# Patient Record
Sex: Male | Born: 1950 | Marital: Single | Smoking: Former smoker
Health system: Southern US, Community
[De-identification: ages and names within clinical notes are randomized; demographics above are authoritative.]

## PROBLEM LIST (undated history)

## (undated) DIAGNOSIS — E785 Hyperlipidemia, unspecified: Secondary | ICD-10-CM

## (undated) DIAGNOSIS — E039 Hypothyroidism, unspecified: Secondary | ICD-10-CM

## (undated) DIAGNOSIS — G4730 Sleep apnea, unspecified: Secondary | ICD-10-CM

## (undated) DIAGNOSIS — E079 Disorder of thyroid, unspecified: Secondary | ICD-10-CM

## (undated) DIAGNOSIS — I10 Essential (primary) hypertension: Secondary | ICD-10-CM

## (undated) HISTORY — DX: Essential (primary) hypertension: I10

## (undated) HISTORY — DX: Hyperlipidemia, unspecified: E78.5

## (undated) HISTORY — DX: Disorder of thyroid, unspecified: E07.9

## (undated) HISTORY — PX: DRUG INDUCED ENDOSCOPY: SHX6808

## (undated) HISTORY — PX: COLONOSCOPY: SHX174

---

## 2018-11-15 ENCOUNTER — Encounter: Payer: Self-pay | Admitting: Cardiology

## 2018-11-15 ENCOUNTER — Other Ambulatory Visit: Payer: Self-pay

## 2018-11-15 ENCOUNTER — Ambulatory Visit (INDEPENDENT_AMBULATORY_CARE_PROVIDER_SITE_OTHER): Payer: Medicare Other | Admitting: Cardiology

## 2018-11-15 VITALS — BP 132/74 | HR 50 | Temp 97.3°F | Ht 63.0 in | Wt 160.0 lb

## 2018-11-15 DIAGNOSIS — G4733 Obstructive sleep apnea (adult) (pediatric): Secondary | ICD-10-CM

## 2018-11-15 DIAGNOSIS — I10 Essential (primary) hypertension: Secondary | ICD-10-CM | POA: Diagnosis not present

## 2018-11-15 DIAGNOSIS — Z87891 Personal history of nicotine dependence: Secondary | ICD-10-CM | POA: Diagnosis not present

## 2018-11-15 DIAGNOSIS — I209 Angina pectoris, unspecified: Secondary | ICD-10-CM | POA: Diagnosis not present

## 2018-11-15 DIAGNOSIS — E782 Mixed hyperlipidemia: Secondary | ICD-10-CM | POA: Diagnosis not present

## 2018-11-15 MED ORDER — ISOSORBIDE MONONITRATE ER 30 MG PO TB24: 30.0000 mg | ORAL_TABLET | Freq: Every day | ORAL | 1 refills | Status: DC

## 2018-11-15 MED ORDER — NITROGLYCERIN 0.4 MG SL SUBL: 0.4000 mg | SUBLINGUAL_TABLET | SUBLINGUAL | 2 refills | Status: DC | PRN

## 2018-11-15 MED ORDER — AMLODIPINE BESYLATE 5 MG PO TABS: 5.0000 mg | ORAL_TABLET | Freq: Every day | ORAL | 1 refills | Status: DC

## 2018-11-15 NOTE — Progress Notes (Signed)
Primary Physician/Referring:  Thomas Banks, No Pcp Per  Thomas Banks ID: Majour Frei, male    DOB: 27-Feb-1950, 68 y.o.   MRN: [id number]  Chief Complaint  Thomas Banks presents with  . Chest Pain  . New Thomas Banks (Initial Visit)   HPI:    HPI: Maxtyn Nuzum  is a 68 y.o. with hypertension, hyperlipidemia, and hypothyroidism, presents for evaluation of chest pain.   For the last 1 week, he has noticed some chest pain that he originally noted while running. He has since had on and off chest pain that can even happen at rest or with activities. Generally last for a few minutes before resolving on its own. Denies any shortness of breath. He does notice some tingling/numbness sensation in his left arm with running recently. He generally exercises for 40 minutes twice a day with intervals of walking and running for 2-3 minutes at a time. He has not done this for the last few days due to his symptoms.    He has noticed increased sweating at night while sleeping. He has been found to have sleep apnea many years ago, and was told to use a machine, but he did not feel that he needed to. He does state that he wakes up several times throughout the night.    He is a former smoker that quit many years ago. He does drink a few glasses of scotch a couple of nights a week. No family history of heart disease. He has recently retired.   Past Medical History:  Diagnosis Date  . Hyperlipidemia   . Hypertension   . Thyroid disease    History reviewed. No pertinent surgical history. Social History   Socioeconomic History  . Marital status: Single    Spouse name: Not on file  . Number of children: 3  . Years of education: Not on file  . Highest education level: Not on file  Occupational History  . Not on file  Social Needs  . Financial resource strain: Not on file  . Food insecurity    Worry: Not on file    Inability: Not on file  . Transportation needs    Medical: Not on file    Non-medical: Not on file   Tobacco Use  . Smoking status: Former Smoker    Packs/day: 0.25    Years: 25.00    Pack years: 6.25    Types: Cigarettes    Quit date: 2000    Years since quitting: 20.8  . Smokeless tobacco: Never Used  Substance and Sexual Activity  . Alcohol use: Yes    Comment: occ  . Drug use: Never  . Sexual activity: Not on file  Lifestyle  . Physical activity    Days per week: Not on file    Minutes per session: Not on file  . Stress: Not on file  Relationships  . Social Musician on phone: Not on file    Gets together: Not on file    Attends religious service: Not on file    Active member of club or organization: Not on file    Attends meetings of clubs or organizations: Not on file    Relationship status: Not on file  . Intimate partner violence    Fear of current or ex partner: Not on file    Emotionally abused: Not on file    Physically abused: Not on file    Forced sexual activity: Not on file  Other Topics Concern  .  Not on file  Social History Narrative  . Not on file   ROS  Review of Systems  Constitution: Negative for decreased appetite, malaise/fatigue, weight gain and weight loss.  Eyes: Negative for visual disturbance.  Cardiovascular: Positive for chest pain. Negative for claudication, dyspnea on exertion, leg swelling, orthopnea, palpitations and syncope.  Respiratory: Negative for hemoptysis and wheezing.   Endocrine: Negative for cold intolerance and heat intolerance.  Hematologic/Lymphatic: Does not bruise/bleed easily.  Skin: Negative for nail changes.  Musculoskeletal: Negative for muscle weakness and myalgias.  Gastrointestinal: Negative for abdominal pain, change in bowel habit, nausea and vomiting.  Neurological: Negative for difficulty with concentration, dizziness, focal weakness and headaches.  Psychiatric/Behavioral: Negative for altered mental status and suicidal ideas.  All other systems reviewed and are negative.  Objective  Blood  pressure 132/74, pulse (!) 50, temperature (!) 97.3 F (36.3 C), height 5\' 3"  (1.6 m), weight 160 lb (72.6 kg), SpO2 98 %. Body mass index is 28.34 kg/m.   Physical Exam  Constitutional: He is oriented to person, place, and time. Vital signs are normal. He appears well-developed and well-nourished.  HENT:  Head: Normocephalic and atraumatic.  Neck: Normal range of motion.  Cardiovascular: Normal rate, regular rhythm, normal heart sounds and intact distal pulses.  Pulmonary/Chest: Effort normal and breath sounds normal. No accessory muscle usage. No respiratory distress.  Abdominal: Soft. Bowel sounds are normal.  Musculoskeletal: Normal range of motion.  Neurological: He is alert and oriented to person, place, and time.  Skin: Skin is warm and dry.  Vitals reviewed.  Radiology: No results found.  Laboratory examination:   No results for input(s): NA, K, CL, CO2, GLUCOSE, BUN, CREATININE, CALCIUM, GFRNONAA, GFRAA in the last 8760 hours. No flowsheet data found. No flowsheet data found. Lipid Panel  No results found for: CHOL, TRIG, HDL, CHOLHDL, VLDL, LDLCALC, LDLDIRECT HEMOGLOBIN A1C No results found for: HGBA1C, MPG TSH No results for input(s): TSH in the last 8760 hours. Medications   Current Outpatient Medications  Medication Instructions  . amLODipine (NORVASC) 5 mg, Oral, Daily  . aspirin EC 81 mg, Oral, Daily  . Cyanocobalamin (VITAMIN B 12 PO) 1 tablet, Oral, Daily  . isosorbide mononitrate (IMDUR) 30 mg, Oral, Daily  . levothyroxine (SYNTHROID) 125 mcg, Oral, Daily before breakfast  . Multiple Vitamin (MULTIVITAMIN) capsule 1 capsule, Oral, Daily  . nitroGLYCERIN (NITROSTAT) 0.4 mg, Sublingual, Every 5 min PRN  . rosuvastatin (CRESTOR) 20 mg, Oral, Daily    Cardiac Studies:     Assessment     ICD-10-CM   1. Angina pectoris (HCC)  I20.9 EKG 12-Lead    PCV ECHOCARDIOGRAM COMPLETE    PCV MYOCARDIAL PERFUSION WO LEXISCAN  2. Mixed hyperlipidemia  E78.2   3.  Primary hypertension  I10   4. Former smoker  Z87.891   5. Obstructive sleep apnea  G47.33     EKG 11/15/2018: Sinus bradycardia at 49 bpm, normal axis, no evidence of ischemia.  Recommendations:   Thomas Banks presents for evaluation of chest pain that started 1 week ago.  Thomas Banks has several risk factors for CAD.  EKG and physical exam are unremarkable.  His symptoms are concerning for angina, he will need further evaluation.  We will schedule for exercise nuclear stress testing as well as an echocardiogram to exclude any structural abnormalities.  His blood pressure is well controlled with lisinopril, but will discontinue this in view of his symptoms of angina and change him to amlodipine 5 mg daily.  We  will also add isosorbide 30 mg daily.  I have given him a prescription for nitroglycerin and advised how to use.  Encouraged him to continue to take it easy until he can have further work-up.  He is presently on low-dose aspirin.  He reports his lipids are well controlled, will obtain recent labs from PCP office for our records and further risk stratification.  I will plan to see him back after the test for further conditions and reevaluation.   *I have discussed this case with Dr. Einar Gip and he personally examined the Thomas Banks and participated in formulating the plan.*   Miquel Dunn, MSN, APRN, FNP-C [facility]. [place] Office: [phone] Fax: [phone]

## 2018-11-16 ENCOUNTER — Ambulatory Visit: Payer: Medicare Other | Admitting: Cardiology

## 2018-11-16 NOTE — Progress Notes (Signed)
Patient here only to discuss with Dr. Einar Gip.

## 2018-11-18 NOTE — Progress Notes (Signed)
Labs 10/12/2018: CBC normal, serum glucose 76 mg, BUN 11, creatinine 1.01, eGFR 74/89 ML, potassium 5.4, CMP otherwise normal.  PSA normal.  TSH elevated at 11.23.  Hepatitis see antibody negative.  Total cholesterol 145, triglycerides 91, HDL 61, LDL 67.  Non-HDL cholesterol 85.

## 2018-11-19 ENCOUNTER — Ambulatory Visit (INDEPENDENT_AMBULATORY_CARE_PROVIDER_SITE_OTHER): Payer: Medicare Other

## 2018-11-19 DIAGNOSIS — I209 Angina pectoris, unspecified: Secondary | ICD-10-CM

## 2018-11-22 ENCOUNTER — Ambulatory Visit (INDEPENDENT_AMBULATORY_CARE_PROVIDER_SITE_OTHER): Payer: Medicare Other

## 2018-11-22 DIAGNOSIS — I209 Angina pectoris, unspecified: Secondary | ICD-10-CM

## 2018-11-23 NOTE — Progress Notes (Signed)
S/w pt advised him of test results

## 2019-01-10 ENCOUNTER — Other Ambulatory Visit: Payer: Self-pay

## 2019-01-10 ENCOUNTER — Ambulatory Visit (INDEPENDENT_AMBULATORY_CARE_PROVIDER_SITE_OTHER): Payer: Medicare Other | Admitting: Cardiology

## 2019-01-10 ENCOUNTER — Encounter: Payer: Self-pay | Admitting: Cardiology

## 2019-01-10 VITALS — BP 130/76 | HR 61 | Temp 96.7°F | Resp 16 | Ht 63.0 in | Wt 156.9 lb

## 2019-01-10 DIAGNOSIS — I209 Angina pectoris, unspecified: Secondary | ICD-10-CM | POA: Diagnosis not present

## 2019-01-10 DIAGNOSIS — I10 Essential (primary) hypertension: Secondary | ICD-10-CM

## 2019-01-10 DIAGNOSIS — E782 Mixed hyperlipidemia: Secondary | ICD-10-CM

## 2019-01-10 MED ORDER — AMLODIPINE BESYLATE 5 MG PO TABS: 5.0000 mg | ORAL_TABLET | Freq: Every day | ORAL | 2 refills | Status: DC

## 2019-01-10 NOTE — Progress Notes (Signed)
Primary Physician/Referring:  Lujean Amel, MD  Patient ID: Thomas Banks, male    DOB: 1950/07/04, 69 y.o.   MRN: [id number]  No chief complaint on file.  HPI:    HPI: Thomas Banks  is a 69 y.o. with hypertension, hyperlipidemia, and hypothyroidism, presents for f/u of chest pain.  He is a former smoker that quit many years ago. He does drink a few glasses of scotch a couple of nights a week. No family history of heart disease. He has recently retired. He underwent nuclear stress testing and also echocardiogram and presents for follow-up.   On his last office visit I had discontinued lisinopril and switched him to amlodipine 5 mg and also isosorbide mononitrate.  He developed severe headache and also nausea has discontinued both in his back on lisinopril.  He still has similar anginal symptoms if she starts to run but able to walk same distance without anginal symptoms.  Past Medical History:  Diagnosis Date  . Hyperlipidemia   . Hypertension   . Thyroid disease    No past surgical history on file. Social History   Socioeconomic History  . Marital status: Single    Spouse name: Not on file  . Number of children: 3  . Years of education: Not on file  . Highest education level: Not on file  Occupational History  . Not on file  Tobacco Use  . Smoking status: Former Smoker    Packs/day: 0.25    Years: 25.00    Pack years: 6.25    Types: Cigarettes    Quit date: 2000    Years since quitting: 21.0  . Smokeless tobacco: Never Used  Substance and Sexual Activity  . Alcohol use: Yes    Comment: occ  . Drug use: Never  . Sexual activity: Not on file  Other Topics Concern  . Not on file  Social History Narrative  . Not on file   Social Determinants of Health   Financial Resource Strain:   . Difficulty of Paying Living Expenses: Not on file  Food Insecurity:   . Worried About Charity fundraiser in the Last Year: Not on file  . Ran Out of Food in the Last Year: Not on  file  Transportation Needs:   . Lack of Transportation (Medical): Not on file  . Lack of Transportation (Non-Medical): Not on file  Physical Activity:   . Days of Exercise per Week: Not on file  . Minutes of Exercise per Session: Not on file  Stress:   . Feeling of Stress : Not on file  Social Connections:   . Frequency of Communication with Friends and Family: Not on file  . Frequency of Social Gatherings with Friends and Family: Not on file  . Attends Religious Services: Not on file  . Active Member of Clubs or Organizations: Not on file  . Attends Archivist Meetings: Not on file  . Marital Status: Not on file  Intimate Partner Violence:   . Fear of Current or Ex-Partner: Not on file  . Emotionally Abused: Not on file  . Physically Abused: Not on file  . Sexually Abused: Not on file   ROS  Review of Systems  Constitution: Negative for decreased appetite, malaise/fatigue, weight gain and weight loss.  Eyes: Negative for visual disturbance.  Cardiovascular: Positive for chest pain. Negative for claudication, dyspnea on exertion, leg swelling, orthopnea, palpitations and syncope.  Respiratory: Negative for hemoptysis and wheezing.   Endocrine: Negative for  cold intolerance and heat intolerance.  Hematologic/Lymphatic: Does not bruise/bleed easily.  Skin: Negative for nail changes.  Musculoskeletal: Negative for muscle weakness and myalgias.  Gastrointestinal: Negative for abdominal pain, change in bowel habit, nausea and vomiting.  Neurological: Negative for difficulty with concentration, dizziness, focal weakness and headaches.  Psychiatric/Behavioral: Negative for altered mental status and suicidal ideas.  All other systems reviewed and are negative.  Objective  There were no vitals taken for this visit. There is no height or weight on file to calculate BMI.   Vitals with BMI 11/15/2018  Height '5\' 3"'   Weight 160 lbs  BMI 99.83  Systolic 382  Diastolic 74    Pulse 50    Physical Exam  Constitutional: He is oriented to person, place, and time. Vital signs are normal. He appears well-developed and well-nourished.  HENT:  Head: Normocephalic and atraumatic.  Cardiovascular: Normal rate, regular rhythm, normal heart sounds and intact distal pulses.  Pulmonary/Chest: Effort normal and breath sounds normal. No accessory muscle usage. No respiratory distress.  Abdominal: Soft. Bowel sounds are normal.  Musculoskeletal:        General: Normal range of motion.     Cervical back: Normal range of motion.  Neurological: He is alert and oriented to person, place, and time.  Skin: Skin is warm and dry.  Vitals reviewed.  Radiology: No results found.  Laboratory examination:   No results for input(s): NA, K, CL, CO2, GLUCOSE, BUN, CREATININE, CALCIUM, GFRNONAA, GFRAA in the last 8760 hours. No flowsheet data found. No flowsheet data found. Lipid Panel  No results found for: CHOL, TRIG, HDL, CHOLHDL, VLDL, LDLCALC, LDLDIRECT HEMOGLOBIN A1C No results found for: HGBA1C, MPG TSH No results for input(s): TSH in the last 8760 hours.   Labs 10/12/2018: CBC normal, serum glucose 76 mg, BUN 11, creatinine 1.01, eGFR 74/89 ML, potassium 5.4, CMP otherwise normal.  PSA normal.  TSH elevated at 11.23.  Hepatitis see antibody negative.   Total cholesterol 145, triglycerides 91, HDL 61, LDL 67.  Non-HDL cholesterol 85.  Medications   Current Outpatient Medications  Medication Instructions  . amLODipine (NORVASC) 5 mg, Oral, Daily  . aspirin EC 81 mg, Oral, Daily  . Cyanocobalamin (VITAMIN B 12 PO) 1 tablet, Oral, Daily  . isosorbide mononitrate (IMDUR) 30 mg, Oral, Daily  . levothyroxine (SYNTHROID) 125 mcg, Oral, Daily before breakfast  . Multiple Vitamin (MULTIVITAMIN) capsule 1 capsule, Oral, Daily  . nitroGLYCERIN (NITROSTAT) 0.4 mg, Sublingual, Every 5 min PRN  . rosuvastatin (CRESTOR) 20 mg, Oral, Daily   Cardiac Studies:   Exercise  tetrofosmin stress test  11/21/2018: Patient exercised on the Bruce protocol. They exercised for a total of 9 minutes and 29 seconds, achieving approximately 10.94 METs. Baseline heart rate was measured at 64 bpm. A maximum heart rate of 158 beats per minute was achieved, which is 103% of maximum predicted heart rate. Peak EKG/ECG demonstrated normal sinus rhythm. 2 mm horizontal ST depression of the inferolateral leads, resolved at  2 minutes into recovery. During exercise the peak ECG revealed no arrhythmias. During exercise the patient developed no chest pain, and exhibited no symptoms. Exercise was terminated due to fatigue/weakness.  The calculated Duke Treadmill Score is -0.52. Normal left ventricular systolic function. Normal perfusion.  Stress LV EF: 62%.  Overall intermediate risk study due to abnormal stress EKG. Clinical correlation recommended. No previous exam available for comparison.  Echocardiogram 11/19/2018: Left ventricle cavity is normal in size. Mild concentric hypertrophy of the left ventricle.  Normal LV systolic function with EF 62%. Normal global wall motion. Normal diastolic filling pattern.  Mild (Grade I) mitral regurgitation. Mild tricuspid regurgitation.  No evidence of pulmonary hypertension.  Assessment     ICD-10-CM   1. Angina pectoris (HCC)  I20.9   2. Mixed hyperlipidemia  E78.2   3. Primary hypertension  I10     EKG 11/15/2018: Sinus bradycardia at 49 bpm, normal axis, no evidence of ischemia.  Recommendations:   Arun Herrod  is a 69 y.o. with hypertension, hyperlipidemia, and hypothyroidism, presents for f/u of chest pain.  He is a former smoker that quit many years ago.  Due to symptoms suggestive of angina, his last office visit I had added amlodipine and also isosorbide mononitrate.  I reviewed the results of the stress test with the patient.  I also obtained his labs from PCP, lipids are also under excellent control.  He still has class 1-2 angina  pectoris only with extremes of exertion and no rest pain, advised him that we could start back amlodipine 5 mg daily and see how he does.  If he does not tolerate this he can switch back to lisinopril and will continue to follow him medically for any signs of progression of angina pectoris.  He is not on a beta blocker due to underlying marked sinus bradycardia. BP is well controlled.  OV in 6 months or sooner if angina is worse.   Adrian Prows, MD, [facility] 01/10/2019, 2:17 PM [facility]. [place] Office: [phone]

## 2019-01-25 ENCOUNTER — Other Ambulatory Visit: Payer: Self-pay | Admitting: Cardiology

## 2019-01-25 ENCOUNTER — Other Ambulatory Visit: Payer: Self-pay

## 2019-01-25 DIAGNOSIS — I10 Essential (primary) hypertension: Secondary | ICD-10-CM

## 2019-01-25 DIAGNOSIS — I209 Angina pectoris, unspecified: Secondary | ICD-10-CM

## 2019-01-25 MED ORDER — AMLODIPINE BESYLATE 5 MG PO TABS: 5.0000 mg | ORAL_TABLET | Freq: Every day | ORAL | 3 refills | Status: DC

## 2019-01-25 MED ORDER — AMLODIPINE BESYLATE 5 MG PO TABS: 5.0000 mg | ORAL_TABLET | Freq: Every day | ORAL | 2 refills | Status: DC

## 2019-02-04 ENCOUNTER — Ambulatory Visit: Payer: Self-pay

## 2019-02-12 ENCOUNTER — Ambulatory Visit: Payer: Self-pay | Attending: Internal Medicine

## 2019-02-12 DIAGNOSIS — Z23 Encounter for immunization: Secondary | ICD-10-CM | POA: Insufficient documentation

## 2019-02-12 NOTE — Progress Notes (Signed)
   Covid-19 Vaccination Clinic  Name:  Gurpreet Mikhail    MRN: [id number] DOB: 1950/07/01  02/12/2019  Mr. Leinweber was observed post Covid-19 immunization for 15 minutes without incidence. He was provided with Vaccine Information Sheet and instruction to access the V-Safe system.   Mr. Vigo was instructed to call 911 with any severe reactions post vaccine: Marland Kitchen Difficulty breathing  . Swelling of your face and throat  . A fast heartbeat  . A bad rash all over your body  . Dizziness and weakness    Immunizations Administered    Name Date Dose VIS Date Route   Pfizer COVID-19 Vaccine 02/12/2019  4:31 PM 0.3 mL 12/17/2018 Intramuscular   Manufacturer: [employer], [employer]   Lot: [id number]   NDC: [address]-9

## 2019-02-23 ENCOUNTER — Other Ambulatory Visit: Payer: Self-pay

## 2019-02-23 DIAGNOSIS — I10 Essential (primary) hypertension: Secondary | ICD-10-CM

## 2019-02-23 DIAGNOSIS — I209 Angina pectoris, unspecified: Secondary | ICD-10-CM

## 2019-02-23 MED ORDER — AMLODIPINE BESYLATE 5 MG PO TABS: 5.0000 mg | ORAL_TABLET | Freq: Every day | ORAL | 3 refills | Status: DC

## 2019-03-09 ENCOUNTER — Ambulatory Visit: Payer: Medicare Other | Attending: Internal Medicine

## 2019-03-09 DIAGNOSIS — Z23 Encounter for immunization: Secondary | ICD-10-CM | POA: Insufficient documentation

## 2019-03-09 NOTE — Progress Notes (Signed)
   Covid-19 Vaccination Clinic  Name:  Thomas Banks    MRN: [id number] DOB: Dec 15, 1950  03/09/2019  [name] was observed post Covid-19 immunization for 15 minutes without incident. He was provided with Vaccine Information Sheet and instruction to access the V-Safe system.   [name] was instructed to call 911 with any severe reactions post vaccine: Marland Kitchen Difficulty breathing  . Swelling of face and throat  . A fast heartbeat  . A bad rash all over body  . Dizziness and weakness   Immunizations Administered    Name Date Dose VIS Date Route   Pfizer COVID-19 Vaccine 03/09/2019 11:50 AM 0.3 mL 12/17/2018 Intramuscular   Manufacturer: [employer], [employer]   Lot: [id number]   NDC: [address]-7

## 2019-04-05 ENCOUNTER — Other Ambulatory Visit: Payer: Self-pay

## 2019-04-05 DIAGNOSIS — I209 Angina pectoris, unspecified: Secondary | ICD-10-CM

## 2019-04-05 DIAGNOSIS — I10 Essential (primary) hypertension: Secondary | ICD-10-CM

## 2019-04-05 MED ORDER — AMLODIPINE BESYLATE 5 MG PO TABS: 5.0000 mg | ORAL_TABLET | Freq: Every day | ORAL | 3 refills | Status: DC

## 2019-04-05 MED ORDER — LEVOTHYROXINE SODIUM 125 MCG PO TABS: 125.0000 ug | ORAL_TABLET | Freq: Every day | ORAL | 1 refills | Status: DC

## 2019-04-05 MED ORDER — VITAMIN B 12 500 MCG PO TABS: 1.0000 | ORAL_TABLET | Freq: Every day | ORAL | 3 refills | Status: AC

## 2019-04-05 MED ORDER — ROSUVASTATIN CALCIUM 20 MG PO TABS: 20.0000 mg | ORAL_TABLET | Freq: Every day | ORAL | 1 refills | Status: DC

## 2019-04-05 MED ORDER — ISOSORBIDE MONONITRATE ER 30 MG PO TB24: 30.0000 mg | ORAL_TABLET | Freq: Every day | ORAL | 1 refills | Status: DC

## 2019-04-05 MED ORDER — ASPIRIN EC 81 MG PO TBEC: 81.0000 mg | DELAYED_RELEASE_TABLET | Freq: Every day | ORAL | 3 refills | Status: DC

## 2019-04-05 MED ORDER — POLYETHYLENE GLYCOL 3350 17 GM/SCOOP PO POWD: 1.0000 | Freq: Once | ORAL | 0 refills | Status: AC

## 2019-07-12 ENCOUNTER — Ambulatory Visit: Payer: Medicare Other | Admitting: Cardiology

## 2019-07-12 ENCOUNTER — Other Ambulatory Visit: Payer: Self-pay

## 2019-07-12 ENCOUNTER — Encounter: Payer: Self-pay | Admitting: Cardiology

## 2019-07-12 VITALS — BP 151/87 | HR 50 | Resp 16 | Ht 63.0 in | Wt 159.0 lb

## 2019-07-12 DIAGNOSIS — I209 Angina pectoris, unspecified: Secondary | ICD-10-CM

## 2019-07-12 DIAGNOSIS — R739 Hyperglycemia, unspecified: Secondary | ICD-10-CM

## 2019-07-12 DIAGNOSIS — I10 Essential (primary) hypertension: Secondary | ICD-10-CM

## 2019-07-12 DIAGNOSIS — E782 Mixed hyperlipidemia: Secondary | ICD-10-CM

## 2019-07-12 NOTE — Progress Notes (Signed)
Primary Physician/Referring:  Lujean Amel, MD  Patient ID: Thomas Banks, male    DOB: 09-17-1950, 69 y.o.   MRN: [id number]  Chief Complaint  Patient presents with  . Coronary Artery Disease  . Chest Pain  . Follow-up    6 months   HPI:    HPI: Thomas Banks  is a 69 y.o. with hypertension, hyperlipidemia, and hypothyroidism, presents for f/u of chest pain.  He is a former smoker that quit many years ago. He does drink a few glasses of scotch a couple of nights a week. No family history of heart disease.  Since being on amlodipine and also starting him on Crestor, he has not had any recurrence of angina pectoris, 6 months ago he was still having mild symptoms which are now resolved.  He has had a low risk stress test in number of 2020.  He now presents for a 33-monthoffice visit.  Past Medical History:  Diagnosis Date  . Hyperlipidemia   . Hypertension   . Thyroid disease    History reviewed. No pertinent surgical history.   Social History   Tobacco Use  . Smoking status: Former Smoker    Packs/day: 0.25    Years: 25.00    Pack years: 6.25    Types: Cigarettes    Quit date: 2000    Years since quitting: 21.5  . Smokeless tobacco: Never Used  Substance Use Topics  . Alcohol use: Not Currently   Marital Status: Single   ROS  Review of Systems  Cardiovascular: Negative for chest pain, dyspnea on exertion and leg swelling.  Gastrointestinal: Positive for constipation. Negative for melena.   Objective  Blood pressure (!) 151/87, pulse (!) 50, resp. rate 16, height '5\' 3"'  (1.6 m), weight 159 lb (72.1 kg), SpO2 100 %. Body mass index is 28.17 kg/m.   Vitals with BMI 07/12/2019 01/10/2019 11/15/2018  Height '5\' 3"'  '5\' 3"'  '5\' 3"'   Weight 159 lbs 156 lbs 14 oz 160 lbs  BMI 28.17 208.6257.84 Systolic [id number] Diastolic 87 76 74  Pulse 50 61 50    Physical Exam Vitals reviewed.  Constitutional:      Appearance: He is well-developed.  Cardiovascular:     Rate and  Rhythm: Normal rate and regular rhythm.     Pulses: Intact distal pulses.     Heart sounds: Normal heart sounds.  Pulmonary:     Effort: Pulmonary effort is normal. No accessory muscle usage or respiratory distress.     Breath sounds: Normal breath sounds.  Abdominal:     General: Bowel sounds are normal.     Palpations: Abdomen is soft.  Neurological:     Mental Status: He is oriented to person, place, and time.    Radiology: No results found.  Laboratory examination:   External Labs:  Labs 10/12/2018: CBC normal, serum glucose 76 mg, BUN 11, creatinine 1.01, eGFR 74/89 ML, potassium 5.4, CMP otherwise normal.  PSA normal.  TSH elevated at 11.23.  Hepatitis see antibody negative.   Total cholesterol 145, triglycerides 91, HDL 61, LDL 67.  Non-HDL cholesterol 85.  Medications   Current Outpatient Medications  Medication Instructions  . amLODipine (NORVASC) 5 mg, Oral, Daily  . aspirin EC 81 mg, Oral, Daily  . Cyanocobalamin (VITAMIN B 12) 500 MCG TABS 1 tablet, Oral, Daily  . levothyroxine (SYNTHROID) 88 mcg, Oral, Daily before breakfast  . Multiple Vitamin (MULTIVITAMIN) capsule 1 capsule, Oral, Daily  . nitroGLYCERIN (NITROSTAT)  0.4 mg, Sublingual, Every 5 min PRN  . rosuvastatin (CRESTOR) 20 mg, Oral, Daily   Cardiac Studies:   Exercise tetrofosmin stress test  11/21/2018: Patient exercised on the Bruce protocol. They exercised for a total of 9 minutes and 29 seconds, achieving approximately 10.94 METs. Baseline heart rate was measured at 64 bpm. A maximum heart rate of 158 beats per minute was achieved, which is 103% of maximum predicted heart rate. Peak EKG/ECG demonstrated normal sinus rhythm. 2 mm horizontal ST depression of the inferolateral leads, resolved at  2 minutes into recovery. During exercise the peak ECG revealed no arrhythmias. During exercise the patient developed no chest pain, and exhibited no symptoms. Exercise was terminated due to fatigue/weakness.   The calculated Duke Treadmill Score is -0.52. Normal left ventricular systolic function. Normal perfusion.  Stress LV EF: 62%.  Overall intermediate risk study due to abnormal stress EKG. Clinical correlation recommended. No previous exam available for comparison.  Echocardiogram 11/19/2018: Left ventricle cavity is normal in size. Mild concentric hypertrophy of the left ventricle. Normal LV systolic function with EF 62%. Normal global wall motion. Normal diastolic filling pattern.  Mild (Grade I) mitral regurgitation. Mild tricuspid regurgitation.  No evidence of pulmonary hypertension.  EKG:   EKG 07/12/2019: Sinus bradycardia at rate of 51 beats minute, normal axis.  No evidence of ischemia, normal EKG. no significant change from 11/15/2018, sinus bradycardia at rate of 49 bpm.  Assessment     ICD-10-CM   1. Mixed hyperlipidemia  E78.2 EKG 12-Lead  2. Primary hypertension  I10   3. Angina pectoris (HCC)  I20.9   4. Hyperglycemia  R73.9    Recommendations:   HPI: Thomas Banks  is a 69 y.o. with hypertension, hyperlipidemia, and hypothyroidism, presents for f/u of chest pain.  He is a former smoker that quit many years ago. He does drink a few glasses of scotch a couple of nights a week. No family history of heart disease.  Since being on amlodipine and also starting him on Crestor, he has not had any recurrence of angina pectoris, 6 months ago he was still having mild symptoms which are now resolved.  He has had a low risk stress test in number of 2020.  He now presents for a 99-monthoffice visit.  He has resumed all his activity, he has been walking at least between 3 to 5 miles on a daily basis without any recurrence of chest pain.  His lipids are under excellent control, he is tolerating amlodipine.  His blood pressure was elevated today, even after I rechecked that blood pressure was high.  He will continue to monitor this and let me know over the next 1 week how his pressures are  at home.  He does complain of constipation this is chronic and had started even prior to initiation of amlodipine.  Advised him to use Metamucil on a daily basis.  He states that he has now been diagnosed with prediabetes mellitus.  He is trying to lose weight to improve this.  Otherwise he is remained stable from cardiac standpoint, I will see him back in 6 months.   JAdrian Prows MD, [facility]7/06/2019, 3:10 PM Office: [phone]

## 2019-07-28 ENCOUNTER — Other Ambulatory Visit: Payer: Self-pay | Admitting: Cardiology

## 2020-01-12 ENCOUNTER — Ambulatory Visit: Payer: Medicare Other | Admitting: Cardiology

## 2020-01-12 ENCOUNTER — Encounter: Payer: Self-pay | Admitting: Cardiology

## 2020-01-12 ENCOUNTER — Other Ambulatory Visit: Payer: Self-pay

## 2020-01-12 VITALS — BP 130/75 | HR 57 | Resp 16 | Ht 63.0 in | Wt 157.0 lb

## 2020-01-12 DIAGNOSIS — E782 Mixed hyperlipidemia: Secondary | ICD-10-CM

## 2020-01-12 DIAGNOSIS — I10 Essential (primary) hypertension: Secondary | ICD-10-CM

## 2020-01-12 DIAGNOSIS — I209 Angina pectoris, unspecified: Secondary | ICD-10-CM

## 2020-01-12 DIAGNOSIS — R739 Hyperglycemia, unspecified: Secondary | ICD-10-CM

## 2020-01-12 NOTE — Progress Notes (Signed)
Primary Physician/Referring:  Lujean Amel, MD  Patient ID: Thomas Banks, male    DOB: 1950-12-22, 70 y.o.   MRN: [id number]  Chief Complaint  Patient presents with  . Chest Pain  . Hypertension  . Follow-up    6 months  . Hyperlipidemia   HPI:    HPI: Thomas Banks  is a 70 y.o. with hypertension, hyperlipidemia, and hypothyroidism, presents for f/u of chest pain.  He is a former smoker that quit many years ago. He does drink a few glasses of scotch a couple of nights a week. No family history of heart disease.  Since being on amlodipine and also starting him on Crestor, he has not had any recurrence of angina pectoris, 6 months ago he was still having mild symptoms which are now resolved.  He has had a low risk stress test in number of 2020.  He now presents for a 28-monthoffice visit.   Since being on aggressive medical therapy, he has not had any further episodes of chest pain. He has been walking at least between 5 to 8000 steps on a daily basis.  Past Medical History:  Diagnosis Date  . Hyperlipidemia   . Hypertension   . Thyroid disease    History reviewed. No pertinent surgical history.   Social History   Tobacco Use  . Smoking status: Former Smoker    Packs/day: 0.25    Years: 25.00    Pack years: 6.25    Types: Cigarettes    Quit date: 2000    Years since quitting: 22.0  . Smokeless tobacco: Never Used  Substance Use Topics  . Alcohol use: Not Currently   Marital Status: Single   ROS  Review of Systems  Cardiovascular: Negative for chest pain, dyspnea on exertion and leg swelling.  Gastrointestinal: Positive for constipation. Negative for melena.   Objective  Blood pressure 130/75, pulse (!) 57, resp. rate 16, height '5\' 3"'  (1.6 m), weight 157 lb (71.2 kg), SpO2 97 %. Body mass index is 27.81 kg/m.   Vitals with BMI 01/12/2020 07/12/2019 01/10/2019  Height '5\' 3"'  '5\' 3"'  '5\' 3"'   Weight 157 lbs 159 lbs 156 lbs 14 oz  BMI 27.82 [phone] Systolic [id number]  Diastolic 75 87 76  Pulse 57 50 61    Physical Exam Vitals reviewed.  Constitutional:      Appearance: He is well-developed.  Cardiovascular:     Rate and Rhythm: Normal rate and regular rhythm.     Pulses: Intact distal pulses.     Heart sounds: Normal heart sounds.  Pulmonary:     Effort: Pulmonary effort is normal. No accessory muscle usage or respiratory distress.     Breath sounds: Normal breath sounds.  Abdominal:     General: Bowel sounds are normal.     Palpations: Abdomen is soft.  Neurological:     Mental Status: He is oriented to person, place, and time.    Radiology: No results found.  Laboratory examination:   External Labs:   Cholesterol, total 138.000 m 10/19/2019 HDL 62.000 mg 10/19/2019 LDL-C 60.000 mg 10/19/2019 Triglycerides 82.000 mg 10/19/2019  A1C 6.100 % 10/19/2019 TSH 2.760 06/13/2019  Hemoglobin 13.300 g/d 10/19/2019  Creatinine, Serum 1.030 mg/ 10/19/2019 Potassium 4.000 mm 10/19/2019 Magnesium N/D ALT (SGPT) 25.000 U/L 10/19/2019    Labs 10/12/2018: CBC normal, serum glucose 76 mg, BUN 11, creatinine 1.01, eGFR 74/89 ML, potassium 5.4, CMP otherwise normal.  PSA normal.  TSH elevated at  11.23.  Hepatitis see antibody negative.   Total cholesterol 145, triglycerides 91, HDL 61, LDL 67.  Non-HDL cholesterol 85.  Medications   Current Outpatient Medications on File Prior to Visit  Medication Sig Dispense Refill  . amLODipine (NORVASC) 5 MG tablet Take 1 tablet (5 mg total) by mouth daily. 90 tablet 3  . aspirin EC 81 MG tablet Take 1 tablet (81 mg total) by mouth daily. 90 tablet 3  . Cyanocobalamin (VITAMIN B 12) 500 MCG TABS Take 1 tablet by mouth daily. 90 tablet 3  . levothyroxine (SYNTHROID) 88 MCG tablet Take 88 mcg by mouth daily before breakfast.    . Multiple Vitamin (MULTIVITAMIN) capsule Take 1 capsule by mouth daily.    . nitroGLYCERIN (NITROSTAT) 0.4 MG SL tablet Place 1 tablet (0.4 mg total) under the tongue every 5 (five)  minutes as needed for chest pain. 30 tablet 2  . rosuvastatin (CRESTOR) 20 MG tablet TAKE 1 TABLET BY MOUTH  DAILY 90 tablet 3   No current facility-administered medications on file prior to visit.    Cardiac Studies:   Exercise tetrofosmin stress test  11/21/2018: Patient exercised on the Bruce protocol. They exercised for a total of 9 minutes and 29 seconds, achieving approximately 10.94 METs. Baseline heart rate was measured at 64 bpm. A maximum heart rate of 158 beats per minute was achieved, which is 103% of maximum predicted heart rate. Peak EKG/ECG demonstrated normal sinus rhythm. 2 mm horizontal ST depression of the inferolateral leads, resolved at  2 minutes into recovery. During exercise the peak ECG revealed no arrhythmias. During exercise the patient developed no chest pain, and exhibited no symptoms. Exercise was terminated due to fatigue/weakness.  The calculated Duke Treadmill Score is -0.52. Normal left ventricular systolic function. Normal perfusion.  Stress LV EF: 62%.  Overall intermediate risk study due to abnormal stress EKG. Clinical correlation recommended. No previous exam available for comparison.  Echocardiogram 11/19/2018: Left ventricle cavity is normal in size. Mild concentric hypertrophy of the left ventricle. Normal LV systolic function with EF 62%. Normal global wall motion. Normal diastolic filling pattern.  Mild (Grade I) mitral regurgitation. Mild tricuspid regurgitation.  No evidence of pulmonary hypertension.  EKG:     EKG 01/12/2020: Normal sinus rhythm/sinus bradycardia at rate of 53 bpm, normal EKG.   No significant change from EKG 07/12/2019: Sinus bradycardia at rate of 51 beats minute, normal axis.  No evidence of ischemia, normal EKG. no significant change from 11/15/2018, sinus bradycardia at rate of 49 bpm.  Assessment     ICD-10-CM   1. Angina pectoris ([place])  I20.9 EKG 12-Lead  2. Primary hypertension  I10   3. Mixed hyperlipidemia  E78.2    4. Hyperglycemia  R73.9    Recommendations:   HPI: Thomas Banks  is a 70 y.o. with hypertension, hyperlipidemia, and hypothyroidism, presents for f/u of chest pain.  He is a former smoker that quit many years ago. He does drink a 1-2 of scotch drinks a couple of nights a week. No family history of heart disease.  Since being on amlodipine and also starting him on Crestor, he has not had any recurrence of angina pectoris, 6 months ago he was still having mild symptoms which are now resolved.  He has had a low risk stress test in number of 2020.  He now presents for a 37-monthoffice visit.  He remains asymptomatic, no change in his physical exam and EKG is essentially normal. I reviewed his  external labs, lipids are also improved and normal. He is not having any side effect from statins. Continue present medications. Blood pressures well controlled.  He has developed hyperglycemia. Advised him that he does not need to worry too much about this, statins could certainly induce mild hyperglycemia but more importantly he has found significant benefits of being on statins as he has abnormal stress test in the past. Discussed regarding low glycemic diet which he is adhering to now. Otherwise stable from cardiac standpoint, I will see him back in 1 year.    Adrian Prows, MD, [facility] 01/12/2020, 3:33 PM Office: [phone]

## 2020-08-16 DIAGNOSIS — Z79899 Other long term (current) drug therapy: Secondary | ICD-10-CM | POA: Diagnosis not present

## 2020-08-16 DIAGNOSIS — R7309 Other abnormal glucose: Secondary | ICD-10-CM | POA: Diagnosis not present

## 2020-08-16 DIAGNOSIS — R7303 Prediabetes: Secondary | ICD-10-CM | POA: Diagnosis not present

## 2020-08-16 DIAGNOSIS — I10 Essential (primary) hypertension: Secondary | ICD-10-CM | POA: Diagnosis not present

## 2020-08-16 DIAGNOSIS — E039 Hypothyroidism, unspecified: Secondary | ICD-10-CM | POA: Diagnosis not present

## 2020-11-08 DIAGNOSIS — R200 Anesthesia of skin: Secondary | ICD-10-CM | POA: Diagnosis not present

## 2020-11-08 DIAGNOSIS — E7800 Pure hypercholesterolemia, unspecified: Secondary | ICD-10-CM | POA: Diagnosis not present

## 2020-11-08 DIAGNOSIS — E039 Hypothyroidism, unspecified: Secondary | ICD-10-CM | POA: Diagnosis not present

## 2020-11-08 DIAGNOSIS — R7303 Prediabetes: Secondary | ICD-10-CM | POA: Diagnosis not present

## 2020-11-08 DIAGNOSIS — Z23 Encounter for immunization: Secondary | ICD-10-CM | POA: Diagnosis not present

## 2020-11-08 DIAGNOSIS — Z0001 Encounter for general adult medical examination with abnormal findings: Secondary | ICD-10-CM | POA: Diagnosis not present

## 2020-11-08 DIAGNOSIS — I10 Essential (primary) hypertension: Secondary | ICD-10-CM | POA: Diagnosis not present

## 2020-11-08 DIAGNOSIS — Z79899 Other long term (current) drug therapy: Secondary | ICD-10-CM | POA: Diagnosis not present

## 2020-11-08 DIAGNOSIS — G4733 Obstructive sleep apnea (adult) (pediatric): Secondary | ICD-10-CM | POA: Diagnosis not present

## 2020-12-24 DIAGNOSIS — G4733 Obstructive sleep apnea (adult) (pediatric): Secondary | ICD-10-CM | POA: Diagnosis not present

## 2021-01-11 ENCOUNTER — Other Ambulatory Visit: Payer: Self-pay

## 2021-01-11 ENCOUNTER — Encounter: Payer: Self-pay | Admitting: Cardiology

## 2021-01-11 ENCOUNTER — Ambulatory Visit: Payer: Medicare Other | Admitting: Cardiology

## 2021-01-11 VITALS — BP 125/76 | HR 53 | Temp 98.5°F | Resp 16 | Ht 63.0 in | Wt 158.2 lb

## 2021-01-11 DIAGNOSIS — R739 Hyperglycemia, unspecified: Secondary | ICD-10-CM

## 2021-01-11 DIAGNOSIS — I10 Essential (primary) hypertension: Secondary | ICD-10-CM | POA: Diagnosis not present

## 2021-01-11 DIAGNOSIS — E782 Mixed hyperlipidemia: Secondary | ICD-10-CM | POA: Diagnosis not present

## 2021-01-11 NOTE — Progress Notes (Signed)
EKG 01/11/2021: Normal sinus rhythm at rate of 55 bpm, normal EKG.

## 2021-01-11 NOTE — Progress Notes (Signed)
Primary Physician/Referring:  Darrow Bussing, MD  Patient ID: Thomas Banks, male    DOB: [birth year], 71 y.o.   MRN: [id number]  Chief Complaint  Patient presents with   Hypertension   Hyperlipidemia   Follow-up    1 year   HPI:    HPI: Thomas Banks  is a 71 y.o. with hypertension, hyperlipidemia, and hypothyroidism, presents for f/u of chest pain.  He is a former smoker that quit many years ago. He does drink a few glasses of scotch a couple of nights a week. No family history of heart disease.    Since being on aggressive medical therapy, he has not had any further episodes of chest pain. He has been walking at least between 10-15000 steps on a daily basis.  He states that he recently took a sleep study and was told to have sleep apnea.  He would like to try CPAP machine  Past Medical History:  Diagnosis Date   Hyperlipidemia    Hypertension    Thyroid disease    History reviewed. No pertinent surgical history.   Social History   Tobacco Use   Smoking status: Former    Packs/day: 0.25    Years: 25.00    Pack years: 6.25    Types: Cigarettes    Quit date: 2000    Years since quitting: 23.0   Smokeless tobacco: Never  Substance Use Topics   Alcohol use: Not Currently   Marital Status: Single   ROS  Review of Systems  Cardiovascular:  Negative for chest pain, dyspnea on exertion and leg swelling.  Gastrointestinal:  Negative for melena.  Objective  Blood pressure 125/76, pulse (!) 53, temperature 98.5 F (36.9 C), temperature source Temporal, resp. rate 16, height 5\' 3"  (1.6 m), weight 158 lb 3.2 oz (71.8 kg), SpO2 97 %. Body mass index is 28.02 kg/m.   Vitals with BMI 01/11/2021 01/12/2020 07/12/2019  Height 5\' 3"  5\' 3"  5\' 3"   Weight 158 lbs 3 oz 157 lbs 159 lbs  BMI 28.03 27.82 28.17  Systolic 125 130 09/12/2019  Diastolic 76 75 87  Pulse 53 57 50    Physical Exam Vitals reviewed.  Constitutional:      Appearance: He is well-developed.  Cardiovascular:      Rate and Rhythm: Normal rate and regular rhythm.     Pulses: Intact distal pulses.     Heart sounds: Normal heart sounds.  Pulmonary:     Effort: Pulmonary effort is normal. No accessory muscle usage or respiratory distress.     Breath sounds: Normal breath sounds.  Abdominal:     General: Bowel sounds are normal.     Palpations: Abdomen is soft.  Neurological:     Mental Status: He is oriented to person, place, and time.   Radiology: No results found.  Laboratory examination:   External Labs:  Cholesterol, total 153.000 m 11/08/2020 HDL 71.000 mg 11/08/2020 LDL 63.000 mg 11/08/2020 Triglycerides 113.000 m 11/08/2020  A1C 5.900 % 11/08/2020 TSH 1.370 08/16/2020  Hemoglobin 13.300 g/d 10/19/2019  01/09/2020 Glucose 72   70-99  BUN 12   6-26  Creatinine 0.93   0.60-1.30  eGFR2021 89   >60  Sodium 140   136-145  Potassium 4.3     Medications   Current Outpatient Medications on File Prior to Visit  Medication Sig Dispense Refill   amLODipine (NORVASC) 5 MG tablet Take 1 tablet (5 mg total) by mouth daily. 90 tablet 3   aspirin EC 81  MG tablet Take 1 tablet (81 mg total) by mouth daily. 90 tablet 3   Cyanocobalamin (VITAMIN B 12) 500 MCG TABS Take 1 tablet by mouth daily. 90 tablet 3   levothyroxine (SYNTHROID) 88 MCG tablet Take 88 mcg by mouth daily before breakfast.     Multiple Vitamin (MULTIVITAMIN) capsule Take 1 capsule by mouth daily.     rosuvastatin (CRESTOR) 20 MG tablet TAKE 1 TABLET BY MOUTH  DAILY 90 tablet 3   nitroGLYCERIN (NITROSTAT) 0.4 MG SL tablet Place 1 tablet (0.4 mg total) under the tongue every 5 (five) minutes as needed for chest pain. 30 tablet 2   No current facility-administered medications on file prior to visit.    Cardiac Studies:   Exercise tetrofosmin stress test  11/21/2018: Patient exercised on the Bruce protocol. They exercised for a total of 9 minutes and 29 seconds, achieving approximately 10.94 METs. Baseline heart rate was  measured at 64 bpm. A maximum heart rate of 158 beats per minute was achieved, which is 103% of maximum predicted heart rate. Peak EKG/ECG demonstrated normal sinus rhythm. 2 mm horizontal ST depression of the inferolateral leads, resolved at  2 minutes into recovery. During exercise the peak ECG revealed no arrhythmias. During exercise the patient developed no chest pain, and exhibited no symptoms. Exercise was terminated due to fatigue/weakness.  The calculated Duke Treadmill Score is -0.52. Normal left ventricular systolic function. Normal perfusion.  Stress LV EF: 62%.  Overall intermediate risk study due to abnormal stress EKG. Clinical correlation recommended. No previous exam available for comparison.  Echocardiogram 11/19/2018: Left ventricle cavity is normal in size. Mild concentric hypertrophy of the left ventricle. Normal LV systolic function with EF 62%. Normal global wall motion. Normal diastolic filling pattern.  Mild (Grade I) mitral regurgitation. Mild tricuspid regurgitation.  No evidence of pulmonary hypertension.  EKG:   EKG 01/11/2021: Normal sinus rhythm at rate of 55 bpm, normal EKG. no change from 01/12/2020.   Assessment     ICD-10-CM   1. Primary hypertension  I10 EKG 12-Lead    2. Mixed hyperlipidemia  E78.2     3. Hyperglycemia  R73.9      Recommendations:   HPI: Thomas Banks  is a 71 y.o. with hypertension, hyperlipidemia, and hypothyroidism, presents for f/u of chest pain.  He is a former smoker that quit many years ago. He does drink a few glasses of scotch a couple of nights a week. No family history of heart disease.    Since being on aggressive medical therapy, he has not had any further episodes of chest pain. He has been walking at least between 10-15000 steps on a daily basis.  He states that he recently took a sleep study and was told to have sleep apnea.  He would like to try CPAP machine  I reviewed his external labs, lipids under excellent  control, blood pressure is also well controlled, he has not had any recurrence of angina pectoris since being on medical therapy, no changes in the EKG essentially normal.  I will see him back on a as needed basis.  I have reassured him.  With regard to hyperglycemia, only minimal elevation in A1c with a normal fasting blood sugar that has remained stable.  Suspect that statins may be contributing to this as well.  But would continue present medical management.  He will follow-up with his PCP for further management.    Thomas Decamp, MD, [facility] 01/11/2021, 1:34 PM Office: [phone]

## 2021-01-17 DIAGNOSIS — G4733 Obstructive sleep apnea (adult) (pediatric): Secondary | ICD-10-CM | POA: Diagnosis not present

## 2021-02-03 ENCOUNTER — Other Ambulatory Visit: Payer: Self-pay

## 2021-02-03 ENCOUNTER — Emergency Department (HOSPITAL_COMMUNITY)
Admission: EM | Admit: 2021-02-03 | Discharge: 2021-02-03 | Disposition: A | Discharge status: Home / Self Care | Payer: No Typology Code available for payment source | Attending: Emergency Medicine | Admitting: Emergency Medicine

## 2021-02-03 DIAGNOSIS — S91112D Laceration without foreign body of left great toe without damage to nail, subsequent encounter: Secondary | ICD-10-CM | POA: Diagnosis not present

## 2021-02-03 DIAGNOSIS — S99922A Unspecified injury of left foot, initial encounter: Secondary | ICD-10-CM | POA: Diagnosis present

## 2021-02-03 DIAGNOSIS — S92422B Displaced fracture of distal phalanx of left great toe, initial encounter for open fracture: Secondary | ICD-10-CM | POA: Diagnosis not present

## 2021-02-03 DIAGNOSIS — Z7982 Long term (current) use of aspirin: Secondary | ICD-10-CM | POA: Insufficient documentation

## 2021-02-03 DIAGNOSIS — S92422D Displaced fracture of distal phalanx of left great toe, subsequent encounter for fracture with routine healing: Secondary | ICD-10-CM | POA: Diagnosis not present

## 2021-02-03 DIAGNOSIS — W208XXA Other cause of strike by thrown, projected or falling object, initial encounter: Secondary | ICD-10-CM | POA: Insufficient documentation

## 2021-02-03 DIAGNOSIS — Z79899 Other long term (current) drug therapy: Secondary | ICD-10-CM | POA: Diagnosis not present

## 2021-02-03 DIAGNOSIS — Y990 Civilian activity done for income or pay: Secondary | ICD-10-CM | POA: Insufficient documentation

## 2021-02-03 MED ORDER — CEPHALEXIN 500 MG PO CAPS: 500.0000 mg | ORAL_CAPSULE | Freq: Three times a day (TID) | ORAL | 0 refills | Status: AC

## 2021-02-03 MED ORDER — LIDOCAINE HCL (PF) 1 % IJ SOLN
30.0000 mL | Freq: Once | INTRAMUSCULAR | Status: AC
  Administered 2021-02-03: 30 mL
  Filled 2021-02-03: qty 30

## 2021-02-03 NOTE — ED Triage Notes (Signed)
Pt reports he dropped a crowbar on his right big toe around 5:30 AM today while at work. Pt went to an urgent care on [address], they wrapped the toe, put on a boot, and sent him to ED. Pt toe is bruised, swollen, and has lacerations.

## 2021-02-03 NOTE — ED Provider Notes (Signed)
[facility]-EMERGENCY DEPT Provider Note   CSN: [id number] Arrival date & time: 02/03/21  7867     History  Chief Complaint  Patient presents with   Toe Pain    Thomas Banks is a 71 y.o. male.  Patient presents with chief complaint of left foot first toe pain.  He was at work when he dropped a crowbar onto his toe sustained a laceration.  He went to urgent care and had x-rays done showing a distal tip fracture and was sent to the ER for further evaluation.  Patient denies injury elsewhere.  States his tetanus is up-to-date.      Home Medications Prior to Admission medications   Medication Sig Start Date End Date Taking? Authorizing Provider  cephALEXin (KEFLEX) 500 MG capsule Take 1 capsule (500 mg total) by mouth 3 (three) times daily for 7 days. 02/03/21 02/10/21 Yes Cheryll Cockayne, MD  amLODipine (NORVASC) 5 MG tablet Take 1 tablet (5 mg total) by mouth daily. 04/05/19 01/11/21  Yates Decamp, MD  aspirin EC 81 MG tablet Take 1 tablet (81 mg total) by mouth daily. 04/05/19   Yates Decamp, MD  Cyanocobalamin (VITAMIN B 12) 500 MCG TABS Take 1 tablet by mouth daily. 04/05/19   Yates Decamp, MD  levothyroxine (SYNTHROID) 88 MCG tablet Take 88 mcg by mouth daily before breakfast.    [provider]  Multiple Vitamin (MULTIVITAMIN) capsule Take 1 capsule by mouth daily.    [provider]  nitroGLYCERIN (NITROSTAT) 0.4 MG SL tablet Place 1 tablet (0.4 mg total) under the tongue every 5 (five) minutes as needed for chest pain. 11/15/18 07/12/19  Toniann Fail, NP  rosuvastatin (CRESTOR) 20 MG tablet TAKE 1 TABLET BY MOUTH  DAILY 07/29/19   Yates Decamp, MD      Allergies    Patient has no known allergies.    Review of Systems   Review of Systems  Constitutional:  Negative for fever.  HENT:  Negative for ear pain and sore throat.   Eyes:  Negative for pain.  Respiratory:  Negative for cough.   Cardiovascular:  Negative for chest pain.   Gastrointestinal:  Negative for abdominal pain.  Genitourinary:  Negative for flank pain.  Musculoskeletal:  Negative for back pain.  Skin:  Negative for color change and rash.  Neurological:  Negative for syncope.  All other systems reviewed and are negative.  Physical Exam Updated Vital Signs BP (!) 143/84    Pulse 62    Temp 98 F (36.7 C) (Oral)    Ht 5\' 3"  (1.6 m)    Wt 72.6 kg    SpO2 100%    BMI 28.34 kg/m  Physical Exam Constitutional:      Appearance: He is well-developed.  HENT:     Head: Normocephalic.     Nose: Nose normal.  Eyes:     Extraocular Movements: Extraocular movements intact.  Cardiovascular:     Rate and Rhythm: Normal rate.  Pulmonary:     Effort: Pulmonary effort is normal.  Musculoskeletal:     Comments: Left foot first toe appears macerated, with stellate laceration  Skin:    Coloration: Skin is not jaundiced.  Neurological:     Mental Status: He is alert. Mental status is at baseline.    ED Results / Procedures / Treatments   Labs (all labs ordered are listed, but only abnormal results are displayed) Labs Reviewed - No data to display  EKG None  Radiology  No results found.  Procedures .Marland KitchenLaceration Repair  Date/Time: 02/03/2021 11:44 AM Performed by: Cheryll Cockayne, MD Authorized by: Cheryll Cockayne, MD   .Nerve Block  Date/Time: 02/03/2021 11:44 AM Performed by: Cheryll Cockayne, MD Authorized by: Cheryll Cockayne, MD   Comments:     Digital block performed on left foot first toe.  Site prepped with alcohol swabs x3.  23-gauge needle used to instill lidocaine 1% no epinephrine total of 4 cc on each side of the toe and across the top of the toe.  Patient Toller procedure well with adequate anesthesia achieved. .Nail Removal  Date/Time: 02/03/2021 11:44 AM Performed by: Cheryll Cockayne, MD Authorized by: Cheryll Cockayne, MD   Comments:     Left foot first toe nail removed.  Scissors used to dissect underneath nail and needle driver  used to remove the nail.  Subsequent nailbed laceration noted in one 6-0 Ethilon suture used to approximate nailbed injury.  Toenail was then placed back into sutures were placed, 1 on each side to secure the nail back into the matrix.  Xeroform gauze placed afterwards.    Medications Ordered in ED Medications  lidocaine (PF) (XYLOCAINE) 1 % injection 30 mL (30 mLs Infiltration Given 02/03/21 1034)    ED Course/ Medical Decision Making/ A&P                           Medical Decision Making Amount and/or Complexity of Data Reviewed External Data Reviewed: notes.    Details: Review of record shows patient is cardiology visit on January 11, 2021 Radiology:     Details: Review of outside x-ray shows distal left first toe fracture. Discussion of management or test interpretation with external provider(s): Case discussed with on-call podiatry physician  Risk Prescription drug management.           Final Clinical Impression(s) / ED Diagnoses Final diagnoses:  Open displaced fracture of distal phalanx of left great toe, initial encounter    Rx / DC Orders ED Discharge Orders          Ordered    cephALEXin (KEFLEX) 500 MG capsule  3 times daily        02/03/21 1148              [place], Eustace Moore, MD 02/03/21 1148

## 2021-02-03 NOTE — Discharge Instructions (Addendum)
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 Follow up with Podiatry doctor as listed on your discharge paper.  Call them tomorrow to make an appointment.   Return immediately back to the ER if:  Your symptoms worsen within the next 12-24 hours. You develop new symptoms such as new fevers, persistent vomiting, new pain, shortness of breath, or new weakness or numbness, or if you have any other concerns.

## 2021-02-04 ENCOUNTER — Other Ambulatory Visit: Payer: Self-pay | Admitting: Podiatry

## 2021-02-04 ENCOUNTER — Ambulatory Visit (INDEPENDENT_AMBULATORY_CARE_PROVIDER_SITE_OTHER): Payer: No Typology Code available for payment source | Admitting: Podiatry

## 2021-02-04 ENCOUNTER — Other Ambulatory Visit: Payer: Self-pay

## 2021-02-04 ENCOUNTER — Ambulatory Visit (INDEPENDENT_AMBULATORY_CARE_PROVIDER_SITE_OTHER): Payer: No Typology Code available for payment source

## 2021-02-04 ENCOUNTER — Encounter: Payer: Self-pay | Admitting: Podiatry

## 2021-02-04 DIAGNOSIS — S99922A Unspecified injury of left foot, initial encounter: Secondary | ICD-10-CM

## 2021-02-04 DIAGNOSIS — S99921A Unspecified injury of right foot, initial encounter: Secondary | ICD-10-CM

## 2021-02-04 MED ORDER — OXYCODONE-ACETAMINOPHEN 5-325 MG PO TABS: 1.0000 | ORAL_TABLET | ORAL | 0 refills | Status: AC | PRN

## 2021-02-04 MED ORDER — OXYCODONE-ACETAMINOPHEN 5-325 MG PO TABS: 1.0000 | ORAL_TABLET | ORAL | 0 refills | Status: DC | PRN

## 2021-02-04 NOTE — Telephone Encounter (Signed)
Pain medication Rx was sent to the wrong pharmacy. Can you please resend it to [facility] on [address]. I updated the chart with the correct pharmacy attached.

## 2021-02-04 NOTE — Progress Notes (Signed)
°  Subjective:  Patient ID: Thomas Banks, male    DOB: 1950/06/12,   MRN: [id number]  Chief Complaint  Patient presents with   Foot Injury    Patient dropped a crowbar on the right hallux at work yesterday morning. Was seen in the ED for toe fracture yesterday. Toe is actively bleeding at this time    71 y.o. male presents for concern of right great toe injury. Relates yesterday he dropped a crowbar on his foot at work. He was seen in the ED and area was washed out and hallux nail was removed. Laceration was sutured in the nail bed and hallux nail was reattached proximally with two sutures. Patient relates severe pain. Has been taking antibiotics. Has been splinting his toe with spoons as he did not have a shoe to offload the area . Denies any other pedal complaints. Denies n/v/f/c.   Past Medical History:  Diagnosis Date   Hyperlipidemia    Hypertension    Thyroid disease     Objective:  Physical Exam: Vascular: DP/PT pulses 2/4 bilateral. CFT <3 seconds. Normal hair growth on digits. No edema.  Skin. No lacerations or abrasions bilateral feet. Hallux nail bed laceration noted to be sutured in place. Hallux nail was reattached with two sutures proximally into the nail matrix. Appears to be well adhered. Mild erythema and edema surrounding toe. No drainage noted.  Musculoskeletal: MMT 5/5 bilateral lower extremities in DF, PF, Inversion and Eversion. Deceased ROM in DF of ankle joint.  Neurological: Sensation intact to light touch.   Assessment:   1. Foot injury, right, initial encounter      Plan:  Patient was evaluated and treated and all questions answered. Open fracture of right hallux distal phalanx, minimally displaced. Hallux nail bed laceration  -X-rays reviewed and shows minimally displaced fracture through distal phalanx of right hallux.  -Toe irrigated copiously with saline. Dressed with betadine, DSD. Hallux nail reattached with suture appears to be well adhered.   -Off-loading with surgical shoe. Dispensed.  -Continue course of antibiotics  -Pain medication dispensed.  -Discussed if any worsening redness, pain, fever or chills to call or may need to report to the emergency room. Patient expressed understanding.  Follow-up in one week for wound check.    Return in about 1 week (around 02/11/2021) for wound check.   Louann Sjogren, DPM

## 2021-02-11 ENCOUNTER — Other Ambulatory Visit: Payer: Self-pay

## 2021-02-11 ENCOUNTER — Ambulatory Visit (INDEPENDENT_AMBULATORY_CARE_PROVIDER_SITE_OTHER): Payer: No Typology Code available for payment source | Admitting: Podiatry

## 2021-02-11 ENCOUNTER — Encounter: Payer: Self-pay | Admitting: Podiatry

## 2021-02-11 DIAGNOSIS — S92421D Displaced fracture of distal phalanx of right great toe, subsequent encounter for fracture with routine healing: Secondary | ICD-10-CM

## 2021-02-11 MED ORDER — CEPHALEXIN 500 MG PO CAPS: 500.0000 mg | ORAL_CAPSULE | Freq: Four times a day (QID) | ORAL | 0 refills | Status: AC

## 2021-02-11 NOTE — Progress Notes (Signed)
°  Subjective:  Patient ID: Thomas Banks, male    DOB: 12-07-1950,   MRN: [id number]  Chief Complaint  Patient presents with   Foot Injury      Left foot wound follow up/ [id number] Thomas Banks [phone])    71 y.o. male presents for follow-up of right great toe injury . Relates he is doing better without as much pain. Had to change the dressing due to bleeding. Almost finished with antibiotics. Denies any other pedal complaints. Denies n/v/f/c.   Past Medical History:  Diagnosis Date   Hyperlipidemia    Hypertension    Thyroid disease     Objective:  Physical Exam: Vascular: DP/PT pulses 2/4 bilateral. CFT <3 seconds. Normal hair growth on digits. No edema.  Skin. Hallux nail bed laceration noted to be sutured in place. Hallux nail was reattached with two sutures proximally into the nail matrix. Appears to be well adhered. Abrasion starting to heal and appears improved.  No drainage noted.  Musculoskeletal: MMT 5/5 bilateral lower extremities in DF, PF, Inversion and Eversion. Deceased ROM in DF of ankle joint.  Neurological: Sensation intact to light touch.   Assessment:   1. Open displaced fracture of distal phalanx of right great toe with routine healing, subsequent encounter       Plan:  Patient was evaluated and treated and all questions answered. Open fracture of right hallux distal phalanx, minimally displaced. Hallux nail bed laceration  -X-rays reviewed  -Toe irrigated copiously with saline. Dressed with betadine, DSD. Hallux nail reattached with suture appears to be well adhered.  -Off-loading with surgical shoe. Dispensed.  -One more week of antibiotics sent to pharmacy.  -Tylenol or ibuprofen for pain.  -Discussed if any worsening redness, pain, fever or chills to call or may need to report to the emergency room. Patient expressed understanding.  Follow-up in one week for wound check.    Return in about 1 week (around  02/18/2021).   Thomas Banks, DPM

## 2021-02-17 DIAGNOSIS — G4733 Obstructive sleep apnea (adult) (pediatric): Secondary | ICD-10-CM | POA: Diagnosis not present

## 2021-02-18 ENCOUNTER — Ambulatory Visit (INDEPENDENT_AMBULATORY_CARE_PROVIDER_SITE_OTHER): Payer: No Typology Code available for payment source | Admitting: Podiatry

## 2021-02-18 ENCOUNTER — Other Ambulatory Visit: Payer: Self-pay

## 2021-02-18 ENCOUNTER — Encounter: Payer: Self-pay | Admitting: Podiatry

## 2021-02-18 DIAGNOSIS — S92421D Displaced fracture of distal phalanx of right great toe, subsequent encounter for fracture with routine healing: Secondary | ICD-10-CM | POA: Diagnosis not present

## 2021-02-18 NOTE — Progress Notes (Signed)
°  Subjective:  Patient ID: Thomas Banks, male    DOB: 09/03/1950,   MRN: [id number]  No chief complaint on file.   71 y.o. male presents for follow-up of right great toe injury . Relates he is doing better without as much pain. Has been changing dressing daily. . Almost finished with antibiotics. Denies any other pedal complaints. Denies n/v/f/c.   Past Medical History:  Diagnosis Date   Hyperlipidemia    Hypertension    Thyroid disease     Objective:  Physical Exam: Vascular: DP/PT pulses 2/4 bilateral. CFT <3 seconds. Normal hair growth on digits. No edema.  Skin. Hallux nail bed laceration noted to be sutured in place. Hallux nail was reattached with two sutures proximally into the nail matrix. Appears to be well adhered. Abrasion starting to heal and appears improved.  No drainage noted.  Musculoskeletal: MMT 5/5 bilateral lower extremities in DF, PF, Inversion and Eversion. Deceased ROM in DF of ankle joint.  Neurological: Sensation intact to light touch.   Assessment:   1. Open displaced fracture of distal phalanx of right great toe with routine healing, subsequent encounter        Plan:  Patient was evaluated and treated and all questions answered. Open fracture of right hallux distal phalanx, minimally displaced. Hallux nail bed laceration  -X-rays reviewed  -Toe irrigated copiously with saline. Dressed with betadine, DSD. Hallux nail reattached with suture appears to be well adhered. Sutures removed today.  -Off-loading with surgical shoe.  -No abx necessary.  -Tylenol or ibuprofen for pain.  -Discussed if any worsening redness, pain, fever or chills to call or may need to report to the emergency room. Patient expressed understanding.  Follow-up in two weeks and will get X-rays at that time.    Return in about 2 weeks (around 03/04/2021) for wound check, XRays .   Louann Sjogren, DPM

## 2021-02-25 ENCOUNTER — Telehealth: Payer: Self-pay | Admitting: Podiatry

## 2021-02-25 NOTE — Telephone Encounter (Signed)
Karolee Stamps, RN, Mahlon Gammon (Nurse Case Manager) phone number: [phone] Fax: [phone] Email: [email or web address]

## 2021-03-06 ENCOUNTER — Ambulatory Visit (INDEPENDENT_AMBULATORY_CARE_PROVIDER_SITE_OTHER): Payer: No Typology Code available for payment source | Admitting: Podiatry

## 2021-03-06 ENCOUNTER — Encounter: Payer: Self-pay | Admitting: Podiatry

## 2021-03-06 ENCOUNTER — Other Ambulatory Visit: Payer: Self-pay

## 2021-03-06 ENCOUNTER — Ambulatory Visit (INDEPENDENT_AMBULATORY_CARE_PROVIDER_SITE_OTHER): Payer: Medicare Other

## 2021-03-06 DIAGNOSIS — S92421D Displaced fracture of distal phalanx of right great toe, subsequent encounter for fracture with routine healing: Secondary | ICD-10-CM | POA: Diagnosis not present

## 2021-03-06 MED ORDER — OXYCODONE-ACETAMINOPHEN 5-325 MG PO TABS: 1.0000 | ORAL_TABLET | Freq: Four times a day (QID) | ORAL | 0 refills | Status: AC | PRN

## 2021-03-06 NOTE — Progress Notes (Signed)
?  Subjective:  ?Patient ID: Thomas Banks, male    DOB: 1950-10-05,   MRN: [id number] ? ?Chief Complaint  ?Patient presents with  ? Follow-up  ?  2 week follow up / wound check / patient states great toe is much better since the last visit , but it is still painful , constant pain some discoloration and swelling , when he is in pain he states that pain medication helps.  ? ? ?71 y.o. male presents for follow-up of right great toe injury that occurred 02/03/21. Relates he is doing better without as much pain. Relates some pain when he is walking still. Does still have some swelling. Has been taking oxycodone which has helped with pain and requesting refill. Has been changing dressing daily. He has finished antibiotics. Denies any other pedal complaints. Denies n/v/f/c.  ? ?Past Medical History:  ?Diagnosis Date  ? Hyperlipidemia   ? Hypertension   ? Thyroid disease   ? ? ?Objective:  ?Physical Exam: ?Vascular: DP/PT pulses 2/4 bilateral. CFT <3 seconds. Normal hair growth on digits. No edema.  ?Skin.  Hallux nail appears to be well adhered. Hallux laceration and nail bed injury well healed. Nail plate still well adhered.  ?Musculoskeletal: MMT 5/5 bilateral lower extremities in DF, PF, Inversion and Eversion. Deceased ROM in DF of ankle joint.  ?Neurological: Sensation intact to light touch.  ? ?Assessment:  ? ?1. Open displaced fracture of distal phalanx of right great toe with routine healing, subsequent encounter   ? ? ? ? ? ? ?Plan:  ?Patient was evaluated and treated and all questions answered. ?Open fracture of right hallux distal phalanx, minimally displaced. Hallux nail bed laceration-healed ?-X-rays reviewed. Fracture of distal hallux displaced appears to be bony bridge starting to form across fracture line. Toe well-alligned at this time.  ?-Off-loading with surgical shoe for next two weeks then may begin to slowly transition into regular shoes.  ?-Skin laceration is well healed.  ?-Discussed that at some  point the nail plat will likely fall off and new nail may or may not grow back in depending on damage.  ?-No abx necessary.  ?-Refill for oxycodone provided. Discussed just taking these as needed for severe pain. ?-Discussed if any worsening redness, pain, fever or chills to call or may need to report to the emergency room. Patient expressed understanding.  ?Follow-up in four weeks and will get X-rays at that time.  ? ? ?Return in about 4 weeks (around 04/03/2021) for XRs toe fx . ? ? ?Louann Sjogren, DPM  ? ? ?

## 2021-03-12 ENCOUNTER — Ambulatory Visit: Payer: No Typology Code available for payment source | Admitting: Podiatry

## 2021-03-13 ENCOUNTER — Ambulatory Visit: Payer: Medicare Other

## 2021-03-13 ENCOUNTER — Ambulatory Visit (INDEPENDENT_AMBULATORY_CARE_PROVIDER_SITE_OTHER): Payer: Medicare Other | Admitting: Podiatry

## 2021-03-13 ENCOUNTER — Other Ambulatory Visit: Payer: Self-pay

## 2021-03-13 DIAGNOSIS — S92416A Nondisplaced fracture of proximal phalanx of unspecified great toe, initial encounter for closed fracture: Secondary | ICD-10-CM

## 2021-03-14 NOTE — Progress Notes (Signed)
error 

## 2021-03-17 DIAGNOSIS — G4733 Obstructive sleep apnea (adult) (pediatric): Secondary | ICD-10-CM | POA: Diagnosis not present

## 2021-03-19 ENCOUNTER — Ambulatory Visit: Payer: Medicare Other | Admitting: Podiatry

## 2021-03-20 ENCOUNTER — Other Ambulatory Visit: Payer: Self-pay

## 2021-03-20 DIAGNOSIS — I209 Angina pectoris, unspecified: Secondary | ICD-10-CM

## 2021-03-20 DIAGNOSIS — I10 Essential (primary) hypertension: Secondary | ICD-10-CM

## 2021-03-20 MED ORDER — AMLODIPINE BESYLATE 5 MG PO TABS: 5.0000 mg | ORAL_TABLET | Freq: Every day | ORAL | 3 refills | Status: DC

## 2021-04-03 ENCOUNTER — Encounter: Payer: Self-pay | Admitting: Podiatry

## 2021-04-03 ENCOUNTER — Ambulatory Visit (INDEPENDENT_AMBULATORY_CARE_PROVIDER_SITE_OTHER): Payer: No Typology Code available for payment source | Admitting: Podiatry

## 2021-04-03 ENCOUNTER — Ambulatory Visit (INDEPENDENT_AMBULATORY_CARE_PROVIDER_SITE_OTHER): Payer: No Typology Code available for payment source

## 2021-04-03 DIAGNOSIS — S92414A Nondisplaced fracture of proximal phalanx of right great toe, initial encounter for closed fracture: Secondary | ICD-10-CM | POA: Diagnosis not present

## 2021-04-03 DIAGNOSIS — S92416A Nondisplaced fracture of proximal phalanx of unspecified great toe, initial encounter for closed fracture: Secondary | ICD-10-CM

## 2021-04-03 NOTE — Progress Notes (Signed)
?  Subjective:  ?Patient ID: Thomas Banks, male    DOB: 1950-11-14,   MRN: [id number] ? ?Chief Complaint  ?Patient presents with  ? Wound Check  ?  2 week follow up   ? ? ?71 y.o. male presents for follow-up of right great toe injury that occurred 02/03/21. Relates he is doing better without as much pain. Has some occasional numbness but overall doing well.  Denies any other pedal complaints. Denies n/v/f/c.  ? ?Past Medical History:  ?Diagnosis Date  ? Hyperlipidemia   ? Hypertension   ? Thyroid disease   ? ? ?Objective:  ?Physical Exam: ?Vascular: DP/PT pulses 2/4 bilateral. CFT <3 seconds. Normal hair growth on digits. No edema.  ?Skin.  Hallux nail appears to be well adhered. Hallux laceration and nail bed injury well healed. Nail plate still well adhered.  ?Musculoskeletal: MMT 5/5 bilateral lower extremities in DF, PF, Inversion and Eversion. Deceased ROM in DF of ankle joint.  ?Neurological: Sensation intact to light touch.  ? ?Assessment:  ? ?1. Closed nondisplaced fracture of proximal phalanx of great toe, unspecified laterality, initial encounter   ? ? ? ? ? ? ?Plan:  ?Patient was evaluated and treated and all questions answered. ?Open fracture of right hallux distal phalanx, minimally displaced. Hallux nail bed laceration-healed ?-X-rays reviewed. Fracture of distal hallux displaced appears to be well healed there is some areas that are still healing and fracture is apparent. .  Toe well-alligned at this time.  ?-Continue WBAT in regular shoes.  ?-Skin laceration is well healed.  ?-No abx necessary.  ?Follow-up as needed.  ? ? ?No follow-ups on file. ? ? ?Louann Sjogren, DPM  ? ? ?

## 2021-04-15 DIAGNOSIS — G4733 Obstructive sleep apnea (adult) (pediatric): Secondary | ICD-10-CM | POA: Diagnosis not present

## 2021-04-17 DIAGNOSIS — G4733 Obstructive sleep apnea (adult) (pediatric): Secondary | ICD-10-CM | POA: Diagnosis not present

## 2021-05-08 DIAGNOSIS — I739 Peripheral vascular disease, unspecified: Secondary | ICD-10-CM | POA: Diagnosis not present

## 2021-05-08 DIAGNOSIS — E039 Hypothyroidism, unspecified: Secondary | ICD-10-CM | POA: Diagnosis not present

## 2021-05-08 DIAGNOSIS — R0981 Nasal congestion: Secondary | ICD-10-CM | POA: Diagnosis not present

## 2021-05-08 DIAGNOSIS — R7303 Prediabetes: Secondary | ICD-10-CM | POA: Diagnosis not present

## 2021-05-08 DIAGNOSIS — I10 Essential (primary) hypertension: Secondary | ICD-10-CM | POA: Diagnosis not present

## 2021-05-13 ENCOUNTER — Other Ambulatory Visit: Payer: Self-pay | Admitting: Family Medicine

## 2021-05-13 DIAGNOSIS — I739 Peripheral vascular disease, unspecified: Secondary | ICD-10-CM

## 2021-05-17 DIAGNOSIS — G4733 Obstructive sleep apnea (adult) (pediatric): Secondary | ICD-10-CM | POA: Diagnosis not present

## 2021-05-20 ENCOUNTER — Ambulatory Visit
Admission: RE | Admit: 2021-05-20 | Discharge: 2021-05-20 | Disposition: A | Discharge status: Home / Self Care | Payer: Medicare Other | Source: Ambulatory Visit | Attending: Family Medicine | Admitting: Family Medicine

## 2021-05-20 DIAGNOSIS — I739 Peripheral vascular disease, unspecified: Secondary | ICD-10-CM | POA: Diagnosis not present

## 2021-06-12 DIAGNOSIS — G4733 Obstructive sleep apnea (adult) (pediatric): Secondary | ICD-10-CM | POA: Diagnosis not present

## 2021-06-17 DIAGNOSIS — G4733 Obstructive sleep apnea (adult) (pediatric): Secondary | ICD-10-CM | POA: Diagnosis not present

## 2021-06-24 DIAGNOSIS — G4733 Obstructive sleep apnea (adult) (pediatric): Secondary | ICD-10-CM | POA: Diagnosis not present

## 2021-07-12 DIAGNOSIS — E039 Hypothyroidism, unspecified: Secondary | ICD-10-CM | POA: Diagnosis not present

## 2021-07-15 DIAGNOSIS — H25013 Cortical age-related cataract, bilateral: Secondary | ICD-10-CM | POA: Diagnosis not present

## 2021-07-15 DIAGNOSIS — H11002 Unspecified pterygium of left eye: Secondary | ICD-10-CM | POA: Diagnosis not present

## 2021-07-15 DIAGNOSIS — H5203 Hypermetropia, bilateral: Secondary | ICD-10-CM | POA: Diagnosis not present

## 2021-07-15 DIAGNOSIS — H2513 Age-related nuclear cataract, bilateral: Secondary | ICD-10-CM | POA: Diagnosis not present

## 2021-07-17 DIAGNOSIS — G4733 Obstructive sleep apnea (adult) (pediatric): Secondary | ICD-10-CM | POA: Diagnosis not present

## 2021-07-22 DIAGNOSIS — R200 Anesthesia of skin: Secondary | ICD-10-CM | POA: Diagnosis not present

## 2021-07-24 DIAGNOSIS — J382 Nodules of vocal cords: Secondary | ICD-10-CM | POA: Diagnosis not present

## 2021-07-24 DIAGNOSIS — R0989 Other specified symptoms and signs involving the circulatory and respiratory systems: Secondary | ICD-10-CM | POA: Diagnosis not present

## 2021-07-24 DIAGNOSIS — J342 Deviated nasal septum: Secondary | ICD-10-CM | POA: Diagnosis not present

## 2021-08-05 DIAGNOSIS — J381 Polyp of vocal cord and larynx: Secondary | ICD-10-CM | POA: Diagnosis not present

## 2021-08-05 DIAGNOSIS — J343 Hypertrophy of nasal turbinates: Secondary | ICD-10-CM | POA: Diagnosis not present

## 2021-08-05 DIAGNOSIS — J342 Deviated nasal septum: Secondary | ICD-10-CM | POA: Diagnosis not present

## 2021-08-08 ENCOUNTER — Ambulatory Visit: Payer: Self-pay

## 2021-08-08 NOTE — Patient Outreach (Signed)
  Care Coordination   Initial Visit Note   08/08/2021 Name: Soul Hackman MRN: [id number] DOB: 09/13/1950  Rashid Whitenight is a 71 y.o. year old male who sees Koirala, Dibas, MD for primary care. I spoke with  Linton Ham by phone today  What matters to the patients health and wellness today?  Doing well, no concerns    Goals Addressed             This Visit's Progress    Care Coordination Activities - no follow up required       Care Coordination Interventions: SDoH screening completed - no acute challenges identified Completed fall risk assessment - patient low fall risk Determined the patient does not have difficulty paying for or obtaining medications Education provided on the benefit of advance care planing - patient declines to complete at this time Instructed the patient to contact his primary care provider as needed        SDOH assessments and interventions completed:  Yes  SDOH Interventions Today    Flowsheet Row Most Recent Value  SDOH Interventions   Food Insecurity Interventions Intervention Not Indicated  Housing Interventions Intervention Not Indicated  Transportation Interventions Intervention Not Indicated        Care Coordination Interventions Activated:  Yes  Care Coordination Interventions:  Yes, provided   Follow up plan: No further intervention required.   Encounter Outcome:  Pt. Visit Completed   Bevelyn Ngo, BSW, CDP Social Worker, Certified Dementia Practitioner Care Coordination [phone]

## 2021-08-08 NOTE — Patient Instructions (Signed)
Visit Information  Thank you for taking time to visit with me today. Please don't hesitate to contact me if I can be of assistance to you.   Following are the goals we discussed today:   Goals Addressed             This Visit's Progress    Care Coordination Activities - no follow up required       Care Coordination Interventions: SDoH screening completed - no acute challenges identified Completed fall risk assessment - patient low fall risk Determined the patient does not have difficulty paying for or obtaining medications Education provided on the benefit of advance care planing - patient declines to complete at this time Instructed the patient to contact his primary care provider as needed         Please call the care guide team at [phone] if you need to schedule an appointment with me  If you are experiencing a Mental Health or Behavioral Health Crisis or need someone to talk to, please call 1-800-273-TALK (toll free, 24 hour hotline)  Patient verbalizes understanding of instructions and care plan provided today and agrees to view in MyChart. Active MyChart status and patient understanding of how to access instructions and care plan via MyChart confirmed with patient.     No further follow up required: Please contact your primary care provider as needed.  Bevelyn Ngo, BSW, CDP Social Worker, Certified Dementia Practitioner Care Coordination [phone]

## 2021-08-17 DIAGNOSIS — G4733 Obstructive sleep apnea (adult) (pediatric): Secondary | ICD-10-CM | POA: Diagnosis not present

## 2021-09-16 DIAGNOSIS — K219 Gastro-esophageal reflux disease without esophagitis: Secondary | ICD-10-CM | POA: Diagnosis not present

## 2021-09-16 DIAGNOSIS — K2970 Gastritis, unspecified, without bleeding: Secondary | ICD-10-CM | POA: Diagnosis not present

## 2021-09-16 DIAGNOSIS — G4733 Obstructive sleep apnea (adult) (pediatric): Secondary | ICD-10-CM | POA: Diagnosis not present

## 2021-09-16 DIAGNOSIS — K2930 Chronic superficial gastritis without bleeding: Secondary | ICD-10-CM | POA: Diagnosis not present

## 2021-09-17 DIAGNOSIS — G4733 Obstructive sleep apnea (adult) (pediatric): Secondary | ICD-10-CM | POA: Diagnosis not present

## 2021-09-17 DIAGNOSIS — K219 Gastro-esophageal reflux disease without esophagitis: Secondary | ICD-10-CM | POA: Diagnosis not present

## 2021-09-17 DIAGNOSIS — K2930 Chronic superficial gastritis without bleeding: Secondary | ICD-10-CM | POA: Diagnosis not present

## 2021-10-10 DIAGNOSIS — E039 Hypothyroidism, unspecified: Secondary | ICD-10-CM | POA: Diagnosis not present

## 2021-10-10 DIAGNOSIS — M545 Low back pain, unspecified: Secondary | ICD-10-CM | POA: Diagnosis not present

## 2021-10-10 DIAGNOSIS — M25562 Pain in left knee: Secondary | ICD-10-CM | POA: Diagnosis not present

## 2021-10-10 DIAGNOSIS — Z0001 Encounter for general adult medical examination with abnormal findings: Secondary | ICD-10-CM | POA: Diagnosis not present

## 2021-10-10 DIAGNOSIS — M25561 Pain in right knee: Secondary | ICD-10-CM | POA: Diagnosis not present

## 2021-10-10 DIAGNOSIS — R7303 Prediabetes: Secondary | ICD-10-CM | POA: Diagnosis not present

## 2021-10-10 DIAGNOSIS — E7800 Pure hypercholesterolemia, unspecified: Secondary | ICD-10-CM | POA: Diagnosis not present

## 2021-10-11 ENCOUNTER — Ambulatory Visit
Admission: RE | Admit: 2021-10-11 | Discharge: 2021-10-11 | Disposition: A | Discharge status: Home / Self Care | Payer: Medicare Other | Source: Ambulatory Visit | Attending: Family Medicine | Admitting: Family Medicine

## 2021-10-11 ENCOUNTER — Other Ambulatory Visit: Payer: Self-pay | Admitting: Family Medicine

## 2021-10-11 DIAGNOSIS — M545 Low back pain, unspecified: Secondary | ICD-10-CM

## 2021-10-11 DIAGNOSIS — M25561 Pain in right knee: Secondary | ICD-10-CM

## 2021-10-11 DIAGNOSIS — M25562 Pain in left knee: Secondary | ICD-10-CM

## 2021-10-11 DIAGNOSIS — M47816 Spondylosis without myelopathy or radiculopathy, lumbar region: Secondary | ICD-10-CM | POA: Diagnosis not present

## 2021-10-11 DIAGNOSIS — M170 Bilateral primary osteoarthritis of knee: Secondary | ICD-10-CM | POA: Diagnosis not present

## 2021-10-17 DIAGNOSIS — G4733 Obstructive sleep apnea (adult) (pediatric): Secondary | ICD-10-CM | POA: Diagnosis not present

## 2022-02-12 ENCOUNTER — Other Ambulatory Visit: Payer: Self-pay | Admitting: Otolaryngology

## 2022-02-13 ENCOUNTER — Encounter (HOSPITAL_COMMUNITY): Payer: Self-pay | Admitting: Otolaryngology

## 2022-02-13 ENCOUNTER — Other Ambulatory Visit: Payer: Self-pay

## 2022-02-13 NOTE — Progress Notes (Signed)
Spoke with pt for pre-op call. Pt denies cardiac history. Pt is treated for HTN and Hypothyroidism.   Shower instructions given to pt and he voiced understanding.

## 2022-02-13 NOTE — Progress Notes (Signed)
patient verbalized understanding of new arrival time of 0745 and clear liquids okay until 0715

## 2022-02-14 ENCOUNTER — Ambulatory Visit (HOSPITAL_BASED_OUTPATIENT_CLINIC_OR_DEPARTMENT_OTHER): Payer: Medicare Other | Admitting: Anesthesiology

## 2022-02-14 ENCOUNTER — Ambulatory Visit (HOSPITAL_COMMUNITY)
Admission: RE | Admit: 2022-02-14 | Discharge: 2022-02-14 | Disposition: A | Discharge status: Home / Self Care | Payer: Medicare Other | Attending: Otolaryngology | Admitting: Otolaryngology

## 2022-02-14 ENCOUNTER — Encounter (HOSPITAL_COMMUNITY): Payer: Self-pay | Admitting: Otolaryngology

## 2022-02-14 ENCOUNTER — Ambulatory Visit (HOSPITAL_COMMUNITY): Payer: Medicare Other | Admitting: Anesthesiology

## 2022-02-14 ENCOUNTER — Other Ambulatory Visit (HOSPITAL_COMMUNITY): Payer: Self-pay

## 2022-02-14 ENCOUNTER — Other Ambulatory Visit: Payer: Self-pay

## 2022-02-14 ENCOUNTER — Encounter (HOSPITAL_COMMUNITY)
Admission: RE | Disposition: A | Discharge status: Home / Self Care | Payer: Self-pay | Source: Home / Self Care | Attending: Otolaryngology

## 2022-02-14 DIAGNOSIS — R0981 Nasal congestion: Secondary | ICD-10-CM | POA: Diagnosis not present

## 2022-02-14 DIAGNOSIS — J343 Hypertrophy of nasal turbinates: Secondary | ICD-10-CM | POA: Diagnosis not present

## 2022-02-14 DIAGNOSIS — J342 Deviated nasal septum: Secondary | ICD-10-CM

## 2022-02-14 DIAGNOSIS — Z79899 Other long term (current) drug therapy: Secondary | ICD-10-CM | POA: Insufficient documentation

## 2022-02-14 DIAGNOSIS — I10 Essential (primary) hypertension: Secondary | ICD-10-CM | POA: Insufficient documentation

## 2022-02-14 DIAGNOSIS — G4733 Obstructive sleep apnea (adult) (pediatric): Secondary | ICD-10-CM | POA: Diagnosis not present

## 2022-02-14 DIAGNOSIS — E039 Hypothyroidism, unspecified: Secondary | ICD-10-CM | POA: Diagnosis not present

## 2022-02-14 DIAGNOSIS — J3489 Other specified disorders of nose and nasal sinuses: Secondary | ICD-10-CM | POA: Diagnosis not present

## 2022-02-14 DIAGNOSIS — Z87891 Personal history of nicotine dependence: Secondary | ICD-10-CM | POA: Diagnosis not present

## 2022-02-14 HISTORY — PX: NASAL SEPTOPLASTY W/ TURBINOPLASTY: SHX2070

## 2022-02-14 HISTORY — DX: Sleep apnea, unspecified: G47.30

## 2022-02-14 HISTORY — DX: Hypothyroidism, unspecified: E03.9

## 2022-02-14 LAB — CBC
HCT: 41.8 % (ref 39.0–52.0)
Hemoglobin: 13.6 g/dL (ref 13.0–17.0)
MCH: 29.5 pg (ref 26.0–34.0)
MCHC: 32.5 g/dL (ref 30.0–36.0)
MCV: 90.7 fL (ref 80.0–100.0)
Platelets: 280 10*3/uL (ref 150–400)
RBC: 4.61 MIL/uL (ref 4.22–5.81)
RDW: 13.8 % (ref 11.5–15.5)
WBC: 6 10*3/uL (ref 4.0–10.5)
nRBC: 0 % (ref 0.0–0.2)

## 2022-02-14 SURGERY — SEPTOPLASTY, NOSE, WITH NASAL TURBINATE REDUCTION
Anesthesia: General | Site: Nose | Laterality: Bilateral

## 2022-02-14 MED ORDER — LIDOCAINE-EPINEPHRINE 1 %-1:100000 IJ SOLN
INTRAMUSCULAR | Status: DC | PRN
  Administered 2022-02-14: 12 mL

## 2022-02-14 MED ORDER — CHLORHEXIDINE GLUCONATE 0.12 % MT SOLN: 15.0000 mL | Freq: Once | OROMUCOSAL | Status: AC

## 2022-02-14 MED ORDER — ORAL CARE MOUTH RINSE: 15.0000 mL | Freq: Once | OROMUCOSAL | Status: AC

## 2022-02-14 MED ORDER — DEXAMETHASONE SODIUM PHOSPHATE 10 MG/ML IJ SOLN
INTRAMUSCULAR | Status: AC
  Filled 2022-02-14: qty 1

## 2022-02-14 MED ORDER — OXYCODONE HCL 5 MG/5ML PO SOLN: 5.0000 mg | Freq: Once | ORAL | Status: DC | PRN

## 2022-02-14 MED ORDER — OXYCODONE HCL 5 MG PO TABS: 5.0000 mg | ORAL_TABLET | Freq: Once | ORAL | Status: DC | PRN

## 2022-02-14 MED ORDER — FENTANYL CITRATE (PF) 250 MCG/5ML IJ SOLN
INTRAMUSCULAR | Status: AC
  Filled 2022-02-14: qty 5

## 2022-02-14 MED ORDER — EPHEDRINE SULFATE-NACL 50-0.9 MG/10ML-% IV SOSY
PREFILLED_SYRINGE | INTRAVENOUS | Status: DC | PRN
  Administered 2022-02-14: 10 mg via INTRAVENOUS

## 2022-02-14 MED ORDER — LACTATED RINGERS IV SOLN: INTRAVENOUS | Status: DC

## 2022-02-14 MED ORDER — CEFAZOLIN SODIUM-DEXTROSE 2-4 GM/100ML-% IV SOLN
2.0000 g | INTRAVENOUS | Status: AC
  Administered 2022-02-14: 2 g via INTRAVENOUS

## 2022-02-14 MED ORDER — CEFAZOLIN SODIUM-DEXTROSE 2-4 GM/100ML-% IV SOLN
INTRAVENOUS | Status: AC
  Filled 2022-02-14: qty 100

## 2022-02-14 MED ORDER — LIDOCAINE-EPINEPHRINE 1 %-1:100000 IJ SOLN
INTRAMUSCULAR | Status: AC
  Filled 2022-02-14: qty 1

## 2022-02-14 MED ORDER — BACITRACIN ZINC 500 UNIT/GM EX OINT
TOPICAL_OINTMENT | CUTANEOUS | Status: AC
  Filled 2022-02-14: qty 28.35

## 2022-02-14 MED ORDER — ONDANSETRON HCL 4 MG/2ML IJ SOLN
INTRAMUSCULAR | Status: AC
  Filled 2022-02-14: qty 2

## 2022-02-14 MED ORDER — ACETAMINOPHEN 10 MG/ML IV SOLN
INTRAVENOUS | Status: AC
  Filled 2022-02-14: qty 100

## 2022-02-14 MED ORDER — CHLORHEXIDINE GLUCONATE 0.12 % MT SOLN
OROMUCOSAL | Status: AC
  Administered 2022-02-14: 15 mL via OROMUCOSAL
  Filled 2022-02-14: qty 15

## 2022-02-14 MED ORDER — DEXAMETHASONE SODIUM PHOSPHATE 10 MG/ML IJ SOLN
INTRAMUSCULAR | Status: DC | PRN
  Administered 2022-02-14: 10 mg via INTRAVENOUS

## 2022-02-14 MED ORDER — SUGAMMADEX SODIUM 200 MG/2ML IV SOLN
INTRAVENOUS | Status: DC | PRN
  Administered 2022-02-14: 200 mg via INTRAVENOUS

## 2022-02-14 MED ORDER — ONDANSETRON HCL 4 MG/2ML IJ SOLN
INTRAMUSCULAR | Status: DC | PRN
  Administered 2022-02-14: 4 mg via INTRAVENOUS

## 2022-02-14 MED ORDER — PHENYLEPHRINE HCL-NACL 20-0.9 MG/250ML-% IV SOLN
INTRAVENOUS | Status: DC | PRN
  Administered 2022-02-14: 50 ug/min via INTRAVENOUS

## 2022-02-14 MED ORDER — ONDANSETRON HCL 4 MG/2ML IJ SOLN: 4.0000 mg | Freq: Once | INTRAMUSCULAR | Status: DC | PRN

## 2022-02-14 MED ORDER — ROCURONIUM BROMIDE 10 MG/ML (PF) SYRINGE
PREFILLED_SYRINGE | INTRAVENOUS | Status: AC
  Filled 2022-02-14: qty 10

## 2022-02-14 MED ORDER — HYDROMORPHONE HCL 1 MG/ML IJ SOLN: 0.2500 mg | INTRAMUSCULAR | Status: DC | PRN

## 2022-02-14 MED ORDER — LIDOCAINE 2% (20 MG/ML) 5 ML SYRINGE
INTRAMUSCULAR | Status: AC
  Filled 2022-02-14: qty 5

## 2022-02-14 MED ORDER — DOCUSATE SODIUM 100 MG PO CAPS
100.0000 mg | ORAL_CAPSULE | Freq: Two times a day (BID) | ORAL | 0 refills | Status: AC | PRN
  Filled 2022-02-14: qty 14, 7d supply, fill #0

## 2022-02-14 MED ORDER — PROPOFOL 10 MG/ML IV BOLUS
INTRAVENOUS | Status: AC
  Filled 2022-02-14: qty 20

## 2022-02-14 MED ORDER — ROCURONIUM BROMIDE 10 MG/ML (PF) SYRINGE
PREFILLED_SYRINGE | INTRAVENOUS | Status: DC | PRN
  Administered 2022-02-14: 70 mg via INTRAVENOUS

## 2022-02-14 MED ORDER — BACITRACIN 500 UNIT/GM EX OINT
TOPICAL_OINTMENT | CUTANEOUS | Status: DC | PRN
  Administered 2022-02-14: 1

## 2022-02-14 MED ORDER — FENTANYL CITRATE (PF) 250 MCG/5ML IJ SOLN
INTRAMUSCULAR | Status: DC | PRN
  Administered 2022-02-14: 100 ug via INTRAVENOUS
  Administered 2022-02-14: 50 ug via INTRAVENOUS

## 2022-02-14 MED ORDER — HYDROCODONE-ACETAMINOPHEN 5-325 MG PO TABS
1.0000 | ORAL_TABLET | ORAL | 0 refills | Status: AC | PRN
  Filled 2022-02-14: qty 30, 7d supply, fill #0

## 2022-02-14 MED ORDER — ACETAMINOPHEN 10 MG/ML IV SOLN
INTRAVENOUS | Status: DC | PRN
  Administered 2022-02-14: 1000 mg via INTRAVENOUS

## 2022-02-14 MED ORDER — LIDOCAINE 2% (20 MG/ML) 5 ML SYRINGE
INTRAMUSCULAR | Status: DC | PRN
  Administered 2022-02-14: 60 mg via INTRAVENOUS

## 2022-02-14 MED ORDER — 0.9 % SODIUM CHLORIDE (POUR BTL) OPTIME
TOPICAL | Status: DC | PRN
  Administered 2022-02-14: 1000 mL

## 2022-02-14 MED ORDER — PROPOFOL 10 MG/ML IV BOLUS
INTRAVENOUS | Status: DC | PRN
  Administered 2022-02-14: 20 mg via INTRAVENOUS
  Administered 2022-02-14: 100 mg via INTRAVENOUS
  Administered 2022-02-14: 20 mg via INTRAVENOUS

## 2022-02-14 SURGICAL SUPPLY — 25 items
BAG COUNTER SPONGE SURGICOUNT (BAG) ×1 IMPLANT
BLADE INF TURB ROT M4 2 5PK (BLADE) ×1 IMPLANT
CANISTER SUCT 3000ML PPV (MISCELLANEOUS) ×1 IMPLANT
COAGULATOR SUCT SWTCH 10FR 6 (ELECTROSURGICAL) IMPLANT
ELECT REM PT RETURN 9FT ADLT (ELECTROSURGICAL) ×1
ELECTRODE REM PT RTRN 9FT ADLT (ELECTROSURGICAL) IMPLANT
GAUZE SPONGE 2X2 8PLY STRL LF (GAUZE/BANDAGES/DRESSINGS) ×1 IMPLANT
GLOVE BIO SURGEON STRL SZ 6.5 (GLOVE) ×1 IMPLANT
GOWN STRL REUS W/ TWL LRG LVL3 (GOWN DISPOSABLE) ×2 IMPLANT
GOWN STRL REUS W/TWL LRG LVL3 (GOWN DISPOSABLE) ×3
KIT BASIN OR (CUSTOM PROCEDURE TRAY) ×1 IMPLANT
KIT TURNOVER KIT B (KITS) ×1 IMPLANT
NDL HYPO 25GX1X1/2 BEV (NEEDLE) ×1 IMPLANT
NEEDLE HYPO 25GX1X1/2 BEV (NEEDLE) ×2 IMPLANT
NS IRRIG 1000ML POUR BTL (IV SOLUTION) ×1 IMPLANT
PAD ARMBOARD 7.5X6 YLW CONV (MISCELLANEOUS) ×1 IMPLANT
PATTIES SURGICAL .5 X3 (DISPOSABLE) ×1 IMPLANT
SPLINT NASAL DOYLE BI-VL (GAUZE/BANDAGES/DRESSINGS) ×1 IMPLANT
SUT CHROMIC 4 0 P 3 18 (SUTURE) ×1 IMPLANT
SUT PLAIN 4 0 ~~LOC~~ 1 (SUTURE) ×1 IMPLANT
SUT SILK 2 0 SH (SUTURE) ×1 IMPLANT
TOWEL GREEN STERILE FF (TOWEL DISPOSABLE) ×1 IMPLANT
TRAY ENT MC OR (CUSTOM PROCEDURE TRAY) ×1 IMPLANT
TUBE SALEM SUMP 16 FR W/ARV (TUBING) ×1 IMPLANT
TUBING EXTENTION W/L.L. (IV SETS) ×1 IMPLANT

## 2022-02-14 NOTE — Op Note (Signed)
OPERATIVE NOTE  Depaul Jackovich Date/Time of Admission: 02/14/2022  7:41 AM  CSN: [id number] Attending Provider: Ebbie Latus A, DO Room/Bed: MCPO/NONE DOB: March 13, 1950 Age: 72 y.o.   Pre-Op Diagnosis: Nasal septal deviation, Hypertrophy of inferior nasal turbinate  Post-Op Diagnosis: Nasal septal deviation, Hypertrophy of inferior nasal turbinate  Procedure: Procedure(s): NASAL SEPTOPLASTY WITH BILATERAL INFERIOR TURBINATE REDUCTION  Anesthesia: General  Surgeon(s): Garrett, DO  Staff: Circulator: Zannie Kehr, RN; Darylene Price, RN Relief Scrub: Zannie Kehr, RN Scrub Person: Lindwood Coke, RN; Rolan Bucco  Implants: * No implants in log *  Specimens: * No specimens in log *  Complications: None  EBL: 25 ML  Condition: stable  Operative Findings:  Severe S-shaped septal deviation with large bony spur along the floor of the nose bilaterally, bilateral inferior turbinate hypertrophy  Description of Operation: .[email or web address] operative consent was obtained and the site and surgery were confirmed with the patient and the operating room team, the patient was brought back to the operating room and general endotracheal anesthesia was obtained. Lidocaine 1% with 1:100,000 epinephrine was injected into the nasal septum bilaterally and inferior turbinates bilaterally Afrin-soaked pledgets were placed into the nasal cavity, and the patient was prepped and draped in sterile fashion. Attention was first turned to the left nasal vestibule. A left sided hemi-transfixion incision was made a submucoperichondrial flap was elevated on the left.  A submucous resection of  nasal septal cartilage was performed with care taken to leave a 1 cm caudal and dorsal strut. In doing this, a right-sided submucoperichondrial flap was elevated.  The bony nasal septum was addressed by lifting up the soft tissues, separating the superior septum with a  double action scissor and then removing the inferior deflected portion. The nasal spine was removed with a 39m osteotome. With this completed, the nasal septum was midline. The submucoperichondrial flaps were returned to their anatomic position and hemi-transfixion incision was closed with interrupted 4-0 chromic gut. A 4-0 plain gut suture was used to perform a mattress style stitch in a circular direction from anterior to posterior septum to re-approximate the right and left sided flaps.  Attention was then turned to the inferior turbinates.They were outfractured and then submucous resection was performed by making an incision in the leading edge with a 15 blade, separating the mucosa from bone with a Cottle elevator and then using the micro debrider with a turbinate blade to remove bone. Bacitracin covered DVertell Limbernasal splints were placed in the bilateral nasal cavities and sutured to the columella with a 2-0 Silk suture and a nasal drip pad was applied. An orogastric tube was placed and the stomach cavity was suctioned to reduce postoperative nausea. The patient was turned over to anesthesia service and was extubated in the operating room and transferred to the PACU in stable condition. The patient will be discharged today and followed up in the ENT clinic in 1 week for  splint removal and a postoperative check.   MJason Coop DOttervilleENT  02/14/2022

## 2022-02-14 NOTE — Anesthesia Preprocedure Evaluation (Signed)
Anesthesia Evaluation  Patient identified by MRN, date of birth, ID band Patient awake    Reviewed: Allergy & Precautions, H&P , NPO status , Patient's Chart, lab work & pertinent test results  Airway Mallampati: II  TM Distance: >3 FB Neck ROM: Full    Dental no notable dental hx.    Pulmonary sleep apnea and Continuous Positive Airway Pressure Ventilation , former smoker   Pulmonary exam normal breath sounds clear to auscultation       Cardiovascular hypertension, Pt. on medications Normal cardiovascular exam Rhythm:Regular Rate:Normal     Neuro/Psych negative neurological ROS  negative psych ROS   GI/Hepatic negative GI ROS, Neg liver ROS,,,  Endo/Other  Hypothyroidism    Renal/GU negative Renal ROS  negative genitourinary   Musculoskeletal negative musculoskeletal ROS (+)    Abdominal   Peds negative pediatric ROS (+)  Hematology negative hematology ROS (+)   Anesthesia Other Findings   Reproductive/Obstetrics negative OB ROS                             Anesthesia Physical Anesthesia Plan  ASA: 2  Anesthesia Plan: General   Post-op Pain Management: Ofirmev IV (intra-op)*   Induction: Intravenous  PONV Risk Score and Plan: 2 and Ondansetron, Dexamethasone and Treatment may vary due to age or medical condition  Airway Management Planned: Oral ETT  Additional Equipment:   Intra-op Plan:   Post-operative Plan: Extubation in OR  Informed Consent: I have reviewed the patients History and Physical, chart, labs and discussed the procedure including the risks, benefits and alternatives for the proposed anesthesia with the patient or authorized representative who has indicated his/her understanding and acceptance.     Dental advisory given  Plan Discussed with: CRNA and Surgeon  Anesthesia Plan Comments:        Anesthesia Quick Evaluation

## 2022-02-14 NOTE — Anesthesia Procedure Notes (Signed)
Procedure Name: Intubation Date/Time: 02/14/2022 10:13 AM  Performed by: Cathren Harsh, CRNAPre-anesthesia Checklist: Patient identified, Emergency Drugs available, Suction available and Patient being monitored Patient Re-evaluated:Patient Re-evaluated prior to induction Oxygen Delivery Method: Circle System Utilized Preoxygenation: Pre-oxygenation with 100% oxygen Induction Type: IV induction Ventilation: Mask ventilation without difficulty Laryngoscope Size: Glidescope and 4 Grade View: Grade I Tube type: Oral Tube size: 7.5 mm Number of attempts: 1 Airway Equipment and Method: Stylet and Oral airway Placement Confirmation: ETT inserted through vocal cords under direct vision, positive ETCO2 and breath sounds checked- equal and bilateral Secured at: 23 cm Tube secured with: Tape Dental Injury: Teeth and Oropharynx as per pre-operative assessment  Comments: GLIDESCOPE used to facilitate communal view of glottis given pt H/O vocal cord lesion.  NO lesion noted on glidescope monitor.

## 2022-02-14 NOTE — Anesthesia Postprocedure Evaluation (Signed)
Anesthesia Post Note  Patient: Thomas Banks  Procedure(s) Performed: NASAL SEPTOPLASTY WITH TURBINATE REDUCTION (Bilateral: Nose)     Patient location during evaluation: PACU Anesthesia Type: General Level of consciousness: awake and alert Pain management: pain level controlled Vital Signs Assessment: post-procedure vital signs reviewed and stable Respiratory status: spontaneous breathing, nonlabored ventilation, respiratory function stable and patient connected to nasal cannula oxygen Cardiovascular status: blood pressure returned to baseline and stable Postop Assessment: no apparent nausea or vomiting Anesthetic complications: no  No notable events documented.  Last Vitals:  Vitals:   02/14/22 0801 02/14/22 1122  BP: 136/75 (!) 135/53  Pulse: (!) 50 69  Resp: 18 15  Temp: (!) 36.4 C 36.6 C  SpO2:  100%    Last Pain:  Vitals:   02/14/22 0801  TempSrc: Oral                 Omkar Stratmann S

## 2022-02-14 NOTE — H&P (Signed)
Thomas Banks is an 72 y.o. male.    Chief Complaint:  Nasal congestion,deviated nasal septum  HPI: Patient presents today for planned elective procedure.  He denies any interval change in history since office visit on 08/05/2021:  Thomas Banks is a 72 y.o. male who presents as a return consult, referred by Thomas Banks*, for evaluation and treatment of congestion and left vocal fold nodule. Patient was last seen in our office by Thomas Cassis, PA on 07/24/2021 for his symptoms. 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 Patient was referred to ENT surgeon for further evaluation and discussion of options for treatment. Patient endorses longstanding history of nasal congestion. He states that this has recently gotten more bothersome after diagnosis of OSA and starting on CPAP therapy. He reports history of nasal trauma decades ago. He endorses frequent vocal overuse with shouting and projection. He is a former smoker, but states that he quit over 30 years ago.   Past Medical History:  Diagnosis Date   Hyperlipidemia    Hypertension    Hypothyroidism    Sleep apnea    uses Cpap   Thyroid disease     Past Surgical History:  Procedure Laterality Date   COLONOSCOPY     DRUG INDUCED ENDOSCOPY      Family History  Problem Relation Age of Onset   Cancer Father     Social History:  reports that he quit smoking about 24 years ago. His smoking use included cigarettes. He has a 6.25 pack-year smoking history. He has never used smokeless tobacco. He reports that he does not currently use alcohol. He reports that he does not use drugs.  Allergies:  Allergies  Allergen Reactions   Streptomycin     Medications Prior to Admission  Medication Sig Dispense Refill   amLODipine (NORVASC) 5 MG tablet Take 1 tablet (5 mg total) by mouth daily. 90 tablet 3    aspirin EC 81 MG tablet Take 1 tablet (81 mg total) by mouth daily. 90 tablet 3   Cholecalciferol (VITAMIN D3) 125 MCG (5000 UT) TABS Take 10,000 Units by mouth daily.     Cyanocobalamin (VITAMIN B 12) 500 MCG TABS Take 1 tablet by mouth daily. (Patient taking differently: Take 5,000 mcg by mouth daily.) 90 tablet 3   levothyroxine (SYNTHROID) 88 MCG tablet Take 88 mcg by mouth daily before breakfast.     Multiple Vitamin (MULTIVITAMIN) capsule Take 1 capsule by mouth daily.     Omega 3-6-9 CAPS Take 1 capsule by mouth daily.     rosuvastatin (CRESTOR) 20 MG tablet TAKE 1 TABLET BY MOUTH  DAILY 90 tablet 3   Turmeric (QC TUMERIC COMPLEX PO) Take by mouth. Powder - half spoonful      Results for orders placed or performed during the hospital encounter of 02/14/22 (from the past 48 hour(s))  CBC per protocol     Status: None   Collection Time: 02/14/22  9:02 AM  Result Value Ref Range   WBC 6.0 4.0 - 10.5 K/uL   RBC 4.61 4.22 - 5.81 MIL/uL   Hemoglobin 13.6 13.0 - 17.0 g/dL   HCT 41.8 39.0 - 52.0 %   MCV 90.7 80.0 - 100.0 fL   MCH 29.5 26.0 - 34.0 pg   MCHC 32.5 30.0 - 36.0 g/dL   RDW 13.8 11.5 - 15.5 %   Platelets 280  150 - 400 K/uL   nRBC 0.0 0.0 - 0.2 %    Comment: Performed at [facility], [place] [address] [place], [place] [address]   No results found.  ROS: ROS  Blood pressure 136/75, pulse (!) 50, temperature (!) 97.5 F (36.4 C), temperature source Oral, resp. rate 18, height 5' 3"$  (1.6 m), weight 69.9 kg.  PHYSICAL EXAM: Physical Exam Constitutional:      Appearance: Normal appearance.  HENT:     Right Ear: External ear normal.     Left Ear: External ear normal.  Pulmonary:     Effort: Pulmonary effort is normal.  Neurological:     General: No focal deficit present.     Mental Status: He is alert.  Psychiatric:        Mood and Affect: Mood normal.        Behavior: Behavior normal.    Studies Reviewed: None  Assessment/Plan Thomas Banks is a 72 y.o.  male with  nasal septal deviation, history of nasal trauma, nasal congestion on CPAP - To OR today for septoplasty and bilateral inferior turbinate reduction. Risks of surgery, benefits as well as expected postoperative course and recovery as well as postoperative restrictions were reviewed with patient, who expressed understanding and agreement. All questions answered   Thomas Banks A Thomas Banks 02/14/2022, 9:37 AM

## 2022-02-14 NOTE — Discharge Instructions (Signed)
SEPTOPLASTY Post Operative Instructions  Office: [phone]  The Surgery Itself Septoplasty and turbinate reduction involves general anesthesia, typically for one to two hours. Patients may be sedated for several hours after surgery and may remain sleepy for the better part of the day. Nausea and vomiting are occasionally seen, and usually resolve by the evening of surgery - even without additional medications. Almost all patients can go home the day of surgery.  After Surgery  Facial pressure and fullness similar to a sinus infection/headache is normal after surgery. Breathing through your nose is also difficult due to swelling. A humidifier or vaporizer can be used in the bedroom to prevent throat pain with mouth breathing.   Bloody nasal drainage is normal after this surgery for 5-7 days, usually decreasing in volume with each day that passes. Drainage will flow from the front of the nose and down the back of the throat. Make sure you spit out blood drainage that drips down the back of your throat to prevent nausea/vomiting. You will have a nasal drip pad/sling with gauze to catch drainage from the front of your nose. The dressing may need to be changed frequently during the first 24 hours following surgery. In case of profuse nasal bleeding, you may apply ice to the bridge of the nose and pinch the nose just above the tip and hold for 10 minutes; if bleeding continues, contact the doctors office.   Frequent hot showers or saline nasal rinses (NeilMed) will help break up congestion and clear any clot or mucus that builds up within the nose after surgery. This can be started the day after surgery.   It is more comfortable to sleep with extra pillows or in a recliner for the first few days after surgery until the drainage begins to resolve.    Do not blow your nose for 2 weeks after surgery.   Avoid lifting > 10 lbs. and no vigorous exercise for 2 weeks after Surgery.   Avoid airplane  travel for 2 weeks following sinus surgery; the cabin pressure changes can cause pain and swelling within the nose/sinuses.   Sense of smell and taste are often diminished for several weeks after surgery. There may be some tenderness or numbness in your upper front teeth, which is normal after surgery. You may express old clot, discolored mucus or very large nasal crusts from your nose for up to 3-4 weeks after surgery; depending on how frequently and how effectively you irrigate your nose with the saltwater spray.   You may have absorbable sutures inside of your nose after surgery that will slowly dissolve in 2-3 weeks. Be careful when clearing crusts from the nose since they may be attached to these sutures.  Medications  Pain medication can be used for pain as prescribed. Pain and pressure in the nose is expected after surgery. As the surgical site heals, pain will resolve over the course of a week. Pain medications can cause nausea, which can be prevented if you take them with food or milk.   You can use 2 nasal sprays after surgery: Afrin can be used up to 2 times a day for up to 5 days after surgery (best before bed) to reduce bloody drainage from the nose for the first few days after surgery. Saline/salt water spray can be used as often as you would like starting the day after surgery to prevent crusting inside of the nose. It should be used AT LEAST 6 times a day to minimize crusting,  but can be used more often.    Take all of your routine medications as prescribed, unless told otherwise by your surgeon. Any medications that thin the blood should be avoided. This includes aspirin. Avoid aspirin-like products for the first 72 hours after surgery (Advil, Motrin, Excedrin, Alieve, Celebrex, Naprosyn), but you may use them as needed for pain after 72 hours.

## 2022-02-14 NOTE — Transfer of Care (Signed)
Immediate Anesthesia Transfer of Care Note  Patient: Thomas Banks  Procedure(s) Performed: NASAL SEPTOPLASTY WITH TURBINATE REDUCTION (Bilateral: Nose)  Patient Location: PACU  Anesthesia Type:General  Level of Consciousness: awake, drowsy, patient cooperative, and responds to stimulation  Airway & Oxygen Therapy: Patient Spontanous Breathing and Patient connected to face mask oxygen  Post-op Assessment: Report given to RN, Post -op Vital signs reviewed and stable, and Patient moving all extremities X 4  Post vital signs: Reviewed and stable  Last Vitals:  Vitals Value Taken Time  BP 135/53 02/14/22 1122  Temp    Pulse 63 02/14/22 1123  Resp    SpO2 100 % 02/14/22 1123  Vitals shown include unvalidated device data.  Last Pain:  Vitals:   02/14/22 0801  TempSrc: Oral         Complications: No notable events documented.

## 2022-02-15 ENCOUNTER — Encounter (HOSPITAL_COMMUNITY): Payer: Self-pay | Admitting: Otolaryngology

## 2022-03-10 DIAGNOSIS — I479 Paroxysmal tachycardia, unspecified: Secondary | ICD-10-CM | POA: Diagnosis not present

## 2022-03-12 ENCOUNTER — Ambulatory Visit: Payer: Medicare Other | Admitting: Cardiology

## 2022-03-12 ENCOUNTER — Encounter: Payer: Self-pay | Admitting: Cardiology

## 2022-03-12 VITALS — BP 131/71 | HR 66 | Resp 15 | Ht 63.0 in | Wt 157.0 lb

## 2022-03-12 DIAGNOSIS — R7989 Other specified abnormal findings of blood chemistry: Secondary | ICD-10-CM

## 2022-03-12 DIAGNOSIS — R9439 Abnormal result of other cardiovascular function study: Secondary | ICD-10-CM

## 2022-03-12 DIAGNOSIS — E782 Mixed hyperlipidemia: Secondary | ICD-10-CM

## 2022-03-12 DIAGNOSIS — R072 Precordial pain: Secondary | ICD-10-CM | POA: Diagnosis not present

## 2022-03-12 DIAGNOSIS — R000 Tachycardia, unspecified: Secondary | ICD-10-CM

## 2022-03-12 MED ORDER — METOPROLOL TARTRATE 50 MG PO TABS: 50.0000 mg | ORAL_TABLET | ORAL | 0 refills | Status: DC

## 2022-03-12 NOTE — Patient Instructions (Signed)
Your cardiac CT will be scheduled at one of the below locations:   [facility]  [address]  [place], [place] [address]  (336) [place]  [address]  [place], [place] [address]  [phone]   Please arrive at the [facility] main entrance of [facility] 30-45 minutes prior to test start time.  Proceed to the [facility] Radiology Department (first floor) to check-in and test prep.   Please follow these instructions carefully (unless otherwise directed):   Hold all erectile dysfunction medications at least 48 hours prior to test.   On the Night Before the Test:  Be sure to Drink plenty of water.  Do not consume any caffeinated/decaffeinated beverages or chocolate 12 hours prior to your test.  Do not take any antihistamines 12 hours prior to your test.  If you take Metformin do not take 24 hours prior to test.  If the patient has contrast allergy:  Patient will need a prescription for Prednisone and very clear instructions (as follows):  Prednisone 50 mg - take 13 hours prior to test  Take another Prednisone 50 mg 7 hours prior to test  Take another Prednisone 50 mg 1 hour prior to test  Take Benadryl 50 mg 1 hour prior to test  Patient must complete all four doses of above prophylactic medications.  Patient will need a ride after test due to Benadryl.   On the Day of the Test:  Drink plenty of water. Do not drink any water within one hour of the test.  Do not eat any food 4 hours prior to the test.  You may take your regular medications prior to the test.  Take metoprolol (Lopressor) two hours prior to test.  HOLD Furosemide/Hydrochlorothiazide morning of the test.  FEMALES- please wear underwire-free bra if available    *For Clinical Staff only. Please instruct patient the following:*         -Drink plenty of water        -Hold Furosemide/hydrochlorothiazide morning of the test         -Take metoprolol (Lopressor) 2 hours prior to test (if applicable).                  -If HR is less than 55 BPM- No Beta Blocker                 -IF HR is greater than 55 BPM and patient is less than or equal to 37 yrs old Lopressor '100mg'$  x1.                 -If HR is greater than 55 BPM and patient is greater than 68 yrs old Lopressor 50 mg x1.       Do not give Lopressor to patients with an allergy to lopressor or anyone with asthma or active COPD symptoms (currently taking steroids).         After the Test:  Drink plenty of water.  After receiving IV contrast, you may experience a mild flushed feeling. This is normal.  On occasion, you may experience a mild rash up to 24 hours after the test. This is not dangerous. If this occurs, you can take Benadryl 25 mg and increase your fluid intake.  If you experience trouble breathing, this can be serious. If it is severe call 911 IMMEDIATELY. If it is mild, please call our office.  If  you take any of these medications: Glipizide/Metformin, Avandament, Glucavance, please do not take 48 hours after completing test.     Please contact the cardiac imaging nurse navigator should you have any questions/concerns  Marchia Bond, RN Navigator Cardiac Imaging  [facility]  [phone] Office  [phone] Cell

## 2022-03-12 NOTE — Progress Notes (Signed)
Primary Physician/Referring:  Lujean Amel, MD  Patient ID: Thomas Banks, male    DOB: Nov 05, 1950, 72 y.o.   MRN: [id number]  Chief Complaint  Patient presents with   Tachycardia   New Patient (Initial Visit)   HPI:    HPI: Thomas Banks  is a 72 y.o. with hypertension, hyperglycemia, hyperlipidemia, and hypothyroidism not on therapy, I had seen him a year ago and with prior history of tobacco use, he underwent stress test which was intermediate risk due to abnormal EKG however nuclear perfusion was normal with normal LVEF  As his risk factors were well-controlled and had excellent exercise tolerance, recommended medical therapy.  He now presents as he has noticed his heart rate going up to 160 bpm when he exercises avidly.  Occasionally he has experienced chest discomfort but states that this does not happen every time and last only few minutes.  He continues to exercise regularly.  Denies dyspnea, palpitations, dizziness or syncope.  States that he has been taking all his medications as prescribed.  Past Medical History:  Diagnosis Date   Hyperlipidemia    Hypertension    Hypothyroidism    Sleep apnea    uses Cpap   Thyroid disease    Past Surgical History:  Procedure Laterality Date   COLONOSCOPY     DRUG INDUCED ENDOSCOPY     NASAL SEPTOPLASTY W/ TURBINOPLASTY Bilateral 02/14/2022   Procedure: NASAL SEPTOPLASTY WITH TURBINATE REDUCTION;  Surgeon: Jason Coop, DO;  Location: [facility] OR;  Service: ENT;  Laterality: Bilateral;     Social History   Tobacco Use   Smoking status: Former    Packs/day: 0.25    Years: 25.00    Total pack years: 6.25    Types: Cigarettes    Quit date: 2000    Years since quitting: 24.1   Smokeless tobacco: Never  Substance Use Topics   Alcohol use: Not Currently   Marital Status: Single   ROS  Review of Systems  Cardiovascular:  Negative for chest pain, dyspnea on exertion and leg swelling.  Gastrointestinal:  Negative for melena.    Objective  Blood pressure 131/71, pulse 66, resp. rate 15, height '5\' 3"'$  (1.6 m), weight 157 lb (71.2 kg), SpO2 96 %. Body mass index is 27.81 kg/m.      03/12/2022    1:28 PM 02/14/2022   11:52 AM 02/14/2022   11:45 AM  Vitals with BMI  Height '5\' 3"'$     Weight 157 lbs    BMI [id number]    Systolic [id number]  Diastolic 71 62 67  Pulse 66 66 59    Physical Exam Vitals reviewed.  Constitutional:      Appearance: He is well-developed.  Cardiovascular:     Rate and Rhythm: Normal rate and regular rhythm.     Pulses: Intact distal pulses.     Heart sounds: Normal heart sounds.  Pulmonary:     Effort: Pulmonary effort is normal. No accessory muscle usage or respiratory distress.     Breath sounds: Normal breath sounds.  Abdominal:     General: Bowel sounds are normal.     Palpations: Abdomen is soft.  Neurological:     Mental Status: He is oriented to person, place, and time.   Radiology: No results found.  Laboratory examination:   External Labs:  Labs 03/10/2022:  TSH reduced at 0.32.  Serum glucose 135 mg, BUN 7, creatinine 0.94, EGFR 88 mL, potassium 4.4, LFTs normal.  Hb 13.1/HCT  39.3, platelets 334.  Labs 10/14/2021:  TSH mildly elevated at 5.09.  A1c 6.2%.  Total cholesterol 175, triglycerides 85, HDL 64, LDL 95.  Non-HDL cholesterol 111.  Cholesterol, total 153.000 m 11/08/2020 HDL 71.000 mg 11/08/2020 LDL 63.000 mg 11/08/2020 Triglycerides 113.000 m 11/08/2020  Medications    Current Outpatient Medications:    amLODipine (NORVASC) 5 MG tablet, Take 1 tablet (5 mg total) by mouth daily., Disp: 90 tablet, Rfl: 3   aspirin EC 81 MG tablet, Take 1 tablet (81 mg total) by mouth daily., Disp: 90 tablet, Rfl: 3   Cholecalciferol (VITAMIN D3) 125 MCG (5000 UT) TABS, Take 10,000 Units by mouth daily., Disp: , Rfl:    Cyanocobalamin (VITAMIN B 12) 500 MCG TABS, Take 1 tablet by mouth daily. (Patient taking differently: Take 5,000 mcg by mouth daily.), Disp: 90 tablet,  Rfl: 3   levothyroxine (SYNTHROID) 88 MCG tablet, Take 88 mcg by mouth daily before breakfast., Disp: , Rfl:    metoprolol tartrate (LOPRESSOR) 50 MG tablet, Take 1 tablet (50 mg total) by mouth as directed. Take the night before CT scan and on the day of the CT scan, Disp: 2 tablet, Rfl: 0   Multiple Vitamin (MULTIVITAMIN) capsule, Take 1 capsule by mouth daily., Disp: , Rfl:    Omega 3-6-9 CAPS, Take 1 capsule by mouth daily., Disp: , Rfl:    rosuvastatin (CRESTOR) 20 MG tablet, TAKE 1 TABLET BY MOUTH  DAILY, Disp: 90 tablet, Rfl: 3   Turmeric (QC TUMERIC COMPLEX PO), Take by mouth. Powder - half spoonful, Disp: , Rfl:     Cardiac Studies:   Exercise tetrofosmin stress test  11/21/2018: Patient exercised on the Bruce protocol. They exercised for a total of 9 minutes and 29 seconds, achieving approximately 10.94 METs. Baseline heart rate was measured at 64 bpm. A maximum heart rate of 158 beats per minute was achieved, which is 103% of maximum predicted heart rate. Peak EKG/ECG demonstrated normal sinus rhythm. 2 mm horizontal ST depression of the inferolateral leads, resolved at  2 minutes into recovery. During exercise the peak ECG revealed no arrhythmias. During exercise the patient developed no chest pain, and exhibited no symptoms. Exercise was terminated due to fatigue/weakness.  The calculated Duke Treadmill Score is -0.52. Normal left ventricular systolic function. Normal perfusion.  Stress LV EF: 62%.  Overall intermediate risk study due to abnormal stress EKG. Clinical correlation recommended. No previous exam available for comparison.  Echocardiogram 11/19/2018: Left ventricle cavity is normal in size. Mild concentric hypertrophy of the left ventricle. Normal LV systolic function with EF 62%. Normal global wall motion. Normal diastolic filling pattern.  Mild (Grade I) mitral regurgitation. Mild tricuspid regurgitation.  No evidence of pulmonary hypertension.  EKG:   EKG  03/12/2022: Sinus bradycardia at rate of 56 bpm, otherwise normal EKG.  Compared to 01/11/2021, no change.  Assessment     ICD-10-CM   1. Tachycardia  R00.0 EKG 12-Lead    2. Precordial pain  R07.2 CT CORONARY MORPH W/CTA COR W/SCORE W/CA W/CM &/OR WO/CM    3. Abnormal stress ECG  R94.39 CT CORONARY MORPH W/CTA COR W/SCORE W/CA W/CM &/OR WO/CM    4. Abnormal TSH  R79.89 TSH+T4F+T3Free    5. Mixed hyperlipidemia  E78.2 Lipid Panel With LDL/HDL Ratio     Recommendations:   HPI: Thomas Banks  is a 72 y.o. with hypertension, hyperglycemia, hyperlipidemia, and hypothyroidism not on therapy, I had seen him a year ago and with prior history of  tobacco use, he underwent stress test which was intermediate risk due to abnormal EKG however nuclear perfusion was normal with normal LVEF  1. Precordial pain As his risk factors were well-controlled and had excellent exercise tolerance, recommended medical therapy.  He now presents as he has noticed his heart rate going up to 160 bpm when he exercises avidly.  Occasionally he has experienced chest discomfort but states that this does not happen every time and last only few minutes.   I would like to obtain coronary CT angiogram.  His TSH was low, I will obtain complete thyroid panel to see whether he has some form of thyroid issues causing tachycardia.  No neck pain, no recent weight changes.  2. Tachycardia He has resting bradycardia due to patient being avidly active and exercising regularly.  However with physical activity his heart rate has been increasing up to 160 bpm, I would like to exclude significant CAD in view of previously abnormal stress EKG.  I will make further recommendation after this.  For now he is advised to just walk but not run out dog.  3. Abnormal stress ECG I reviewed the stress test from 11/21/2018 again.  I do not think repeating the stress test will be appropriate.  4. Abnormal TSH Thyroid panel has been ordered.  5. Mixed  hyperlipidemia Reviewed his lipids, his LDL was very well-controlled at 63, pleasant labs in October 23 revealing elevated LDL at 95.  I will repeat lipid profile testing.  I would like to see him back in 4 to 6 weeks for follow-up.   Thomas Prows, MD, [facility] 03/12/2022, 2:14 PM Office: [phone]

## 2022-03-13 DIAGNOSIS — E782 Mixed hyperlipidemia: Secondary | ICD-10-CM | POA: Diagnosis not present

## 2022-03-13 DIAGNOSIS — R7989 Other specified abnormal findings of blood chemistry: Secondary | ICD-10-CM | POA: Diagnosis not present

## 2022-03-14 LAB — TSH+T4F+T3FREE
Free T4: 1.75 ng/dL (ref 0.82–1.77)
T3, Free: 3 pg/mL (ref 2.0–4.4)
TSH: 0.536 u[IU]/mL (ref 0.450–4.500)

## 2022-03-14 LAB — LIPID PANEL WITH LDL/HDL RATIO
Cholesterol, Total: 153 mg/dL (ref 100–199)
HDL: 67 mg/dL (ref >39)
LDL Chol Calc (NIH): 72 mg/dL (ref 0–99)
LDL/HDL Ratio: 1.1 ratio (ref 0.0–3.6)
Triglycerides: 70 mg/dL (ref 0–149)
VLDL Cholesterol Cal: 14 mg/dL (ref 5–40)

## 2022-03-21 ENCOUNTER — Other Ambulatory Visit: Payer: Self-pay

## 2022-03-21 DIAGNOSIS — I10 Essential (primary) hypertension: Secondary | ICD-10-CM

## 2022-03-22 LAB — BMP8+EGFR
BUN/Creatinine Ratio: 11 (ref 10–24)
BUN: 10 mg/dL (ref 8–27)
CO2: 22 mmol/L (ref 20–29)
Calcium: 10 mg/dL (ref 8.6–10.2)
Chloride: 103 mmol/L (ref 96–106)
Creatinine, Ser: 0.89 mg/dL (ref 0.76–1.27)
Glucose: 95 mg/dL (ref 70–99)
Potassium: 4.4 mmol/L (ref 3.5–5.2)
Sodium: 142 mmol/L (ref 134–144)
eGFR: 93 mL/min/{1.73_m2} (ref >59)

## 2022-03-24 DIAGNOSIS — G5602 Carpal tunnel syndrome, left upper limb: Secondary | ICD-10-CM | POA: Diagnosis not present

## 2022-03-24 DIAGNOSIS — Z79899 Other long term (current) drug therapy: Secondary | ICD-10-CM | POA: Diagnosis not present

## 2022-03-24 DIAGNOSIS — Z809 Family history of malignant neoplasm, unspecified: Secondary | ICD-10-CM | POA: Diagnosis not present

## 2022-03-24 DIAGNOSIS — R7303 Prediabetes: Secondary | ICD-10-CM | POA: Diagnosis not present

## 2022-03-25 ENCOUNTER — Telehealth (HOSPITAL_COMMUNITY): Payer: Self-pay | Admitting: *Deleted

## 2022-03-25 NOTE — Telephone Encounter (Signed)
Reaching out to patient to offer assistance regarding upcoming cardiac imaging study; pt verbalizes understanding of appt date/time, parking situation and where to check in, pre-test NPO status and medications ordered, and verified current allergies; name and call back number provided for further questions should they arise  Gordy Clement RN Navigator Cardiac Imaging [facility] [phone] office [phone] cell  Patient to take 50mg  metoprolol tartrate in evening before and 50mg  metoprolol tartrate two hours prior to his cardiac CT scan. He is aware to arrive at 8am.

## 2022-03-26 ENCOUNTER — Ambulatory Visit (HOSPITAL_COMMUNITY)
Admission: RE | Admit: 2022-03-26 | Discharge: 2022-03-26 | Disposition: A | Discharge status: Home / Self Care | Payer: Medicare Other | Source: Ambulatory Visit | Attending: Cardiology | Admitting: Cardiology

## 2022-03-26 DIAGNOSIS — R072 Precordial pain: Secondary | ICD-10-CM

## 2022-03-26 DIAGNOSIS — I2089 Other forms of angina pectoris: Secondary | ICD-10-CM | POA: Insufficient documentation

## 2022-03-26 DIAGNOSIS — R9439 Abnormal result of other cardiovascular function study: Secondary | ICD-10-CM

## 2022-03-26 MED ORDER — NITROGLYCERIN 0.4 MG SL SUBL: 0.8000 mg | SUBLINGUAL_TABLET | Freq: Once | SUBLINGUAL | Status: AC

## 2022-03-26 MED ORDER — NITROGLYCERIN 0.4 MG SL SUBL
SUBLINGUAL_TABLET | SUBLINGUAL | Status: AC
  Administered 2022-03-26: 0.8 mg via SUBLINGUAL
  Filled 2022-03-26: qty 2

## 2022-03-26 MED ORDER — IOHEXOL 350 MG/ML SOLN
100.0000 mL | Freq: Once | INTRAVENOUS | Status: AC | PRN
  Administered 2022-03-26: 100 mL via INTRAVENOUS

## 2022-03-27 DIAGNOSIS — R072 Precordial pain: Secondary | ICD-10-CM | POA: Insufficient documentation

## 2022-03-27 DIAGNOSIS — R9439 Abnormal result of other cardiovascular function study: Secondary | ICD-10-CM | POA: Insufficient documentation

## 2022-04-01 ENCOUNTER — Telehealth: Payer: Self-pay

## 2022-04-01 NOTE — Telephone Encounter (Signed)
error 

## 2022-04-01 NOTE — Progress Notes (Signed)
Coronary CTA 03/27/2022: Coronary calcium score of 0.  Right dominant circulation, normal coronary arteries.  Ascending and descending thoracic aortic measurements are normal. Extracardiac abnormality: 1. 1.8 cm heterogeneously enhancing lesion in the left lobe of the liver. Recommend further evaluation with MRI of the abdomen with and without contrast. 2. Calcified mediastinal lymph nodes compatible with prior granulomatous disease.

## 2022-04-07 DIAGNOSIS — H11002 Unspecified pterygium of left eye: Secondary | ICD-10-CM | POA: Diagnosis not present

## 2022-04-15 DIAGNOSIS — H5213 Myopia, bilateral: Secondary | ICD-10-CM | POA: Diagnosis not present

## 2022-04-16 DIAGNOSIS — R001 Bradycardia, unspecified: Secondary | ICD-10-CM | POA: Diagnosis not present

## 2022-04-16 DIAGNOSIS — E119 Type 2 diabetes mellitus without complications: Secondary | ICD-10-CM | POA: Diagnosis not present

## 2022-04-16 DIAGNOSIS — I10 Essential (primary) hypertension: Secondary | ICD-10-CM | POA: Diagnosis not present

## 2022-04-17 ENCOUNTER — Other Ambulatory Visit: Payer: Self-pay | Admitting: Gastroenterology

## 2022-04-17 DIAGNOSIS — K769 Liver disease, unspecified: Secondary | ICD-10-CM

## 2022-04-22 DIAGNOSIS — G4733 Obstructive sleep apnea (adult) (pediatric): Secondary | ICD-10-CM | POA: Diagnosis not present

## 2022-04-24 ENCOUNTER — Encounter: Payer: Self-pay | Admitting: Cardiology

## 2022-04-24 ENCOUNTER — Ambulatory Visit: Payer: Medicare Other | Admitting: Cardiology

## 2022-04-24 VITALS — BP 124/76 | HR 56 | Resp 16 | Ht 63.0 in | Wt 155.0 lb

## 2022-04-24 DIAGNOSIS — R739 Hyperglycemia, unspecified: Secondary | ICD-10-CM | POA: Diagnosis not present

## 2022-04-24 DIAGNOSIS — I10 Essential (primary) hypertension: Secondary | ICD-10-CM | POA: Diagnosis not present

## 2022-04-24 DIAGNOSIS — E782 Mixed hyperlipidemia: Secondary | ICD-10-CM | POA: Diagnosis not present

## 2022-04-24 NOTE — Progress Notes (Signed)
Primary Physician/Referring:  Darrow Bussing, MD  Patient ID: Thomas Banks, male    DOB: December 10, 1950, 72 y.o.   MRN: [id number]  Chief Complaint  Patient presents with   Chest Pain   Elevated Heart rate   Abnormal stress test   Results    Coronary CT   Follow-up    6 weeks   HPI:    HPI: Thomas Banks  is a 72 y.o. with hypertension, hyperglycemia, hyperlipidemia, and hypothyroidism not on therapy, I had seen him a year ago and with prior history of tobacco use, he underwent stress test which was intermediate risk due to abnormal EKG however nuclear perfusion was normal with normal LVEF  As his risk factors were well-controlled and had excellent exercise tolerance, recommended medical therapy.  Patient was evaluated by me about 6 to 8 weeks ago for chest pain, underwent coronary CT angiogram.  He now presents for follow-up.  He is presently asymptomatic.  He had questions regarding his marked variation in heart rate at night and during the day.  Past Medical History:  Diagnosis Date   Hyperlipidemia    Hypertension    Hypothyroidism    Sleep apnea    uses Cpap   Thyroid disease    Past Surgical History:  Procedure Laterality Date   COLONOSCOPY     DRUG INDUCED ENDOSCOPY     NASAL SEPTOPLASTY W/ TURBINOPLASTY Bilateral 02/14/2022   Procedure: NASAL SEPTOPLASTY WITH TURBINATE REDUCTION;  Surgeon: Laren Boom, DO;  Location: [facility] OR;  Service: ENT;  Laterality: Bilateral;     Social History   Tobacco Use   Smoking status: Former    Packs/day: 0.25    Years: 25.00    Additional pack years: 0.00    Total pack years: 6.25    Types: Cigarettes    Quit date: 2000    Years since quitting: 24.3   Smokeless tobacco: Never  Substance Use Topics   Alcohol use: Not Currently   Marital Status: Single   ROS  Review of Systems  Cardiovascular:  Negative for chest pain, dyspnea on exertion and leg swelling.  Gastrointestinal:  Negative for melena.   Objective  Blood  pressure 124/76, pulse (!) 56, resp. rate 16, height  (1.6 m), weight 155 lb (70.3 kg), SpO2 98 %. Body mass index is 27.46 kg/m.      04/24/2022   11:13 AM 03/26/2022    8:19 AM 03/26/2022    8:05 AM  Vitals with BMI  Height     Weight 155 lbs    BMI 27.46    Systolic 124 101 132  Diastolic 76 63 83  Pulse 56  44    Physical Exam Vitals reviewed.  Constitutional:      Appearance: He is well-developed.  Cardiovascular:     Rate and Rhythm: Normal rate and regular rhythm.     Pulses: Intact distal pulses.     Heart sounds: Normal heart sounds.  Pulmonary:     Effort: Pulmonary effort is normal. No accessory muscle usage or respiratory distress.     Breath sounds: Normal breath sounds.  Abdominal:     General: Bowel sounds are normal.     Palpations: Abdomen is soft.  Neurological:     Mental Status: He is oriented to person, place, and time.    Radiology: No results found.  Laboratory examination:   External Labs:  Labs 03/10/2022:  TSH reduced at 0.32.  Serum glucose 135 mg, BUN 7,  creatinine 0.94, EGFR 88 mL, potassium 4.4, LFTs normal.  Hb 13.1/HCT 39.3, platelets 334.  Labs 10/14/2021:  TSH mildly elevated at 5.09.  A1c 6.2%.  Total cholesterol 175, triglycerides 85, HDL 64, LDL 95.  Non-HDL cholesterol 111.  Cholesterol, total 153.000 m 11/08/2020 HDL 71.000 mg 11/08/2020 LDL 63.000 mg 11/08/2020 Triglycerides 113.000 m 11/08/2020   Cardiac Studies:   Exercise tetrofosmin stress test  11/21/2018: Patient exercised on the Bruce protocol. They exercised for a total of 9 minutes and 29 seconds, achieving approximately 10.94 METs. Baseline heart rate was measured at 64 bpm. A maximum heart rate of 158 beats per minute was achieved, which is 103% of maximum predicted heart rate. Peak EKG/ECG demonstrated normal sinus rhythm. 2 mm horizontal ST depression of the inferolateral leads, resolved at  2 minutes into recovery. During exercise the peak ECG  revealed no arrhythmias. During exercise the patient developed no chest pain, and exhibited no symptoms. Exercise was terminated due to fatigue/weakness.  The calculated Duke Treadmill Score is -0.52. Normal left ventricular systolic function. Normal perfusion.  Stress LV EF: 62%.  Overall intermediate risk study due to abnormal stress EKG. Clinical correlation recommended. No previous exam available for comparison.  Echocardiogram 11/19/2018: Left ventricle cavity is normal in size. Mild concentric hypertrophy of the left ventricle. Normal LV systolic function with EF 62%. Normal global wall motion. Normal diastolic filling pattern.  Mild (Grade I) mitral regurgitation. Mild tricuspid regurgitation.  No evidence of pulmonary hypertension.  Coronary CTA 03/27/2022:  Coronary calcium score of 0.  Right dominant circulation, normal coronary arteries.  Ascending and descending thoracic aortic measurements are normal.  Extracardiac abnormality: 1. 1.8 cm heterogeneously enhancing lesion in the left lobe of the liver. Recommend further evaluation with MRI of the abdomen with and without contrast. 2. Calcified mediastinal lymph nodes compatible with prior granulomatous disease.  EKG:   EKG 03/12/2022: Sinus bradycardia at rate of 56 bpm, otherwise normal EKG.  Compared to 01/11/2021, no change.   Medications and allergies   Allergies  Allergen Reactions   Streptomycin      Current Outpatient Medications:    Cholecalciferol (VITAMIN D3) 125 MCG (5000 UT) TABS, Take 10,000 Units by mouth daily., Disp: , Rfl:    Cyanocobalamin (VITAMIN B 12) 500 MCG TABS, Take 1 tablet by mouth daily. (Patient taking differently: Take 5,000 mcg by mouth daily.), Disp: 90 tablet, Rfl: 3   levothyroxine (SYNTHROID) 88 MCG tablet, Take 88 mcg by mouth daily before breakfast., Disp: , Rfl:    Multiple Vitamin (MULTIVITAMIN) capsule, Take 1 capsule by mouth daily., Disp: , Rfl:    Omega 3-6-9 CAPS, Take 1 capsule  by mouth daily., Disp: , Rfl:    rosuvastatin (CRESTOR) 20 MG tablet, TAKE 1 TABLET BY MOUTH  DAILY, Disp: 90 tablet, Rfl: 3   Turmeric (QC TUMERIC COMPLEX PO), Take by mouth. Powder - half spoonful, Disp: , Rfl:    valsartan (DIOVAN) 80 MG tablet, Take 80 mg by mouth daily., Disp: , Rfl:    Assessment     ICD-10-CM   1. Primary hypertension  I10     2. Mixed hyperlipidemia  E78.2     3. Hyperglycemia  R73.9      Recommendations:   HPI: Arrow Tomko  is a 72 y.o. with hypertension, hyperglycemia, hyperlipidemia, and hypothyroidism not on therapy, I had seen him a year ago and with prior history of tobacco use, he underwent stress test which was intermediate risk due to abnormal EKG  however nuclear perfusion was normal with normal LVEF  1. Primary hypertension I had seen the patient for chest pain, I had ordered coronary CT angiogram.  He has coronary calcium score of 0 and completely normal coronary arteries and normal thoracic aorta.  Mild extracardiac abnormality noted to the liver is being worked up by Dr. Dulce Sellar.  I reassured him.  2. Mixed hyperlipidemia Lipids in excellent control, he is presently on Crestor 20 mg daily.  In view of normal coronary arteries, coronary calcium score of 0, as long as LDL is <100, I do not mind if he wants to reduce the dose of Crestor from 20 mg to 10 mg.  3. Hyperglycemia Mild hyperglycemia is noted he is not a diabetic.  He is amlodipine was discontinued, he is on valsartan which is appropriate for overall cardiovascular protection.  Stable from cardiac standpoint, I will see him back on a as needed basis.  Patient had questions regarding heart rate variability.  Advised him that marked heart rate variability that is noted with marked bradycardia that is asymptomatic at night and regular heart rate and normal heart rate during the daytime is just normal circadian rhythm in a healthy heart.  I reassured him.   Yates Decamp, MD, [facility] 04/24/2022,  11:40 AM Office: [phone]

## 2022-04-28 ENCOUNTER — Ambulatory Visit
Admission: RE | Admit: 2022-04-28 | Discharge: 2022-04-28 | Disposition: A | Discharge status: Home / Self Care | Payer: Medicare Other | Source: Ambulatory Visit | Attending: Gastroenterology | Admitting: Gastroenterology

## 2022-04-28 DIAGNOSIS — K769 Liver disease, unspecified: Secondary | ICD-10-CM

## 2022-04-28 MED ORDER — GADOPICLENOL 0.5 MMOL/ML IV SOLN
7.0000 mL | Freq: Once | INTRAVENOUS | Status: AC | PRN
  Administered 2022-04-28: 7 mL via INTRAVENOUS

## 2022-04-30 DIAGNOSIS — E119 Type 2 diabetes mellitus without complications: Secondary | ICD-10-CM | POA: Diagnosis not present

## 2022-05-07 ENCOUNTER — Other Ambulatory Visit: Payer: Medicare Other

## 2022-05-13 ENCOUNTER — Inpatient Hospital Stay: Payer: Medicare Other | Admitting: Genetic Counselor

## 2022-05-13 ENCOUNTER — Inpatient Hospital Stay: Payer: Medicare Other

## 2022-05-13 ENCOUNTER — Encounter: Payer: Self-pay | Admitting: Genetic Counselor

## 2022-05-13 DIAGNOSIS — H524 Presbyopia: Secondary | ICD-10-CM | POA: Diagnosis not present

## 2022-05-13 DIAGNOSIS — Z809 Family history of malignant neoplasm, unspecified: Secondary | ICD-10-CM

## 2022-05-13 NOTE — Progress Notes (Signed)
REFERRING PROVIDER: Darrow Bussing, MD [address] [place],  [place] [address]  PRIMARY PROVIDER:  Docia Chuck, [place], MD  PRIMARY REASON FOR VISIT:  1. Family history of cancer    HISTORY OF PRESENT ILLNESS:   Thomas Banks, a 72 y.o. male, was seen for a [facility] cancer genetics consultation at the request of Dr. Docia Chuck due to a family history of cancer.  Thomas Banks presents to clinic today to discuss the possibility of a hereditary predisposition to cancer, to discuss genetic testing, and to further clarify his future cancer risks, as well as potential cancer risks for family members.   Thomas Banks is a 72 y.o. male with no personal history of cancer.    Past Medical History:  Diagnosis Date   Hyperlipidemia    Hypertension    Hypothyroidism    Sleep apnea    uses Cpap   Thyroid disease     Past Surgical History:  Procedure Laterality Date   COLONOSCOPY     DRUG INDUCED ENDOSCOPY     NASAL SEPTOPLASTY W/ TURBINOPLASTY Bilateral 02/14/2022   Procedure: NASAL SEPTOPLASTY WITH TURBINATE REDUCTION;  Surgeon: Laren Boom, DO;  Location: [facility] OR;  Service: ENT;  Laterality: Bilateral;    Social History   Socioeconomic History   Marital status: Single    Spouse name: Not on file   Number of children: 3   Years of education: Not on file   Highest education level: Not on file  Occupational History   Not on file  Tobacco Use   Smoking status: Former    Packs/day: 0.25    Years: 25.00    Additional pack years: 0.00    Total pack years: 6.25    Types: Cigarettes    Quit date: 2000    Years since quitting: 24.3   Smokeless tobacco: Never  Vaping Use   Vaping Use: Never used  Substance and Sexual Activity   Alcohol use: Not Currently   Drug use: Never   Sexual activity: Not on file  Other Topics Concern   Not on file  Social History Narrative   Not on file   Social Determinants of Health   Financial Resource Strain: Not on file  Food Insecurity:  No Food Insecurity (08/08/2021)   Hunger Vital Sign    Worried About Running Out of Food in the Last Year: Never true    Ran Out of Food in the Last Year: Never true  Transportation Needs: No Transportation Needs (08/08/2021)   PRAPARE - Administrator, Civil Service (Medical): No    Lack of Transportation (Non-Medical): No  Physical Activity: Not on file  Stress: Not on file  Social Connections: Not on file     FAMILY HISTORY:  We obtained a detailed, 4-generation family history.  Significant diagnoses are listed below: Family History  Problem Relation Age of Onset   Tongue cancer Father 59       smoked      Thomas Banks is unaware of previous family history of genetic testing for hereditary cancer risks. There is no reported Ashkenazi Jewish ancestry.   GENETIC COUNSELING ASSESSMENT: Thomas Banks is a 72 y.o. male with a family history of cancer. We, therefore, discussed and recommended the following at today's visit.   DISCUSSION: Approximately 5 - 10% of cancer is hereditary and there are many genes associated with hereditary cancer syndromes. We reviewed that tongue cancer is not currently know to be hereditary.  We  discussed that testing is beneficial for several reasons, including knowing about other cancer risks, identifying potential screening and risk-reduction options that may be appropriate, and to understanding if other family members could be at risk for cancer and allowing them to undergo genetic testing.  We discussed with Thomas Banks that the family history does not meet insurance or NCCN criteria for genetic testing and, therefore, is not highly consistent with a familial hereditary cancer syndrome. We feel he is at low risk to harbor a gene mutation associated with such a condition. Thus, we did not recommend any genetic testing, at this time, and recommended Thomas Banks continue to follow the cancer screening guidelines given by his primary healthcare provider.  PLAN:  Based on his personal and family history, we did not recommend genetic testing. We still offered testing and Thomas Banks declined.    We encouraged Thomas Banks to remain in contact with cancer genetics so that we can continuously update the family history and inform him of any changes in cancer genetics and testing that may be of benefit for this family.   Thomas Banks questions were answered to his satisfaction today. Our contact information was provided should additional questions or concerns arise. Thank you for the referral and allowing [place] to share in the care of your patient.   Lalla Brothers, MS, [facility] Genetic Counselor [email or web address] (P) [phone]  The patient was seen for a total of 20 minutes in face-to-face genetic counseling. The patient was seen alone.  Drs. Pamelia Hoit and/or Mosetta Putt were available to discuss this case as needed.  _______________________________________________________________________ For Office Staff:  Number of people involved in session: 1 Was an Intern/ student involved with case: no

## 2022-06-18 DIAGNOSIS — E119 Type 2 diabetes mellitus without complications: Secondary | ICD-10-CM | POA: Diagnosis not present

## 2022-06-18 DIAGNOSIS — E039 Hypothyroidism, unspecified: Secondary | ICD-10-CM | POA: Diagnosis not present

## 2022-06-25 DIAGNOSIS — E119 Type 2 diabetes mellitus without complications: Secondary | ICD-10-CM | POA: Diagnosis not present

## 2022-06-25 DIAGNOSIS — E039 Hypothyroidism, unspecified: Secondary | ICD-10-CM | POA: Diagnosis not present

## 2022-07-16 DIAGNOSIS — H5203 Hypermetropia, bilateral: Secondary | ICD-10-CM | POA: Diagnosis not present

## 2022-07-16 DIAGNOSIS — H11002 Unspecified pterygium of left eye: Secondary | ICD-10-CM | POA: Diagnosis not present

## 2022-07-16 DIAGNOSIS — H2513 Age-related nuclear cataract, bilateral: Secondary | ICD-10-CM | POA: Diagnosis not present

## 2022-07-16 DIAGNOSIS — E119 Type 2 diabetes mellitus without complications: Secondary | ICD-10-CM | POA: Diagnosis not present

## 2022-07-16 DIAGNOSIS — H25013 Cortical age-related cataract, bilateral: Secondary | ICD-10-CM | POA: Diagnosis not present

## 2022-07-24 ENCOUNTER — Other Ambulatory Visit: Payer: Self-pay | Admitting: Gastroenterology

## 2022-07-24 DIAGNOSIS — K769 Liver disease, unspecified: Secondary | ICD-10-CM

## 2022-08-08 DIAGNOSIS — G4733 Obstructive sleep apnea (adult) (pediatric): Secondary | ICD-10-CM | POA: Diagnosis not present

## 2022-08-25 DIAGNOSIS — E039 Hypothyroidism, unspecified: Secondary | ICD-10-CM | POA: Diagnosis not present

## 2022-09-05 ENCOUNTER — Ambulatory Visit: Admission: RE | Admit: 2022-09-05 | Payer: Medicare Other | Source: Ambulatory Visit

## 2022-09-05 DIAGNOSIS — K7689 Other specified diseases of liver: Secondary | ICD-10-CM | POA: Diagnosis not present

## 2022-09-05 DIAGNOSIS — K769 Liver disease, unspecified: Secondary | ICD-10-CM

## 2022-09-05 MED ORDER — GADOPICLENOL 0.5 MMOL/ML IV SOLN
7.0000 mL | Freq: Once | INTRAVENOUS | Status: AC | PRN
  Administered 2022-09-05: 7 mL via INTRAVENOUS

## 2022-09-24 DIAGNOSIS — H2513 Age-related nuclear cataract, bilateral: Secondary | ICD-10-CM | POA: Diagnosis not present

## 2022-09-24 DIAGNOSIS — H11002 Unspecified pterygium of left eye: Secondary | ICD-10-CM | POA: Diagnosis not present

## 2022-09-24 DIAGNOSIS — H25013 Cortical age-related cataract, bilateral: Secondary | ICD-10-CM | POA: Diagnosis not present

## 2022-09-24 DIAGNOSIS — H04123 Dry eye syndrome of bilateral lacrimal glands: Secondary | ICD-10-CM | POA: Diagnosis not present

## 2022-10-09 DIAGNOSIS — H11052 Peripheral pterygium, progressive, left eye: Secondary | ICD-10-CM | POA: Diagnosis not present

## 2022-10-09 DIAGNOSIS — H52202 Unspecified astigmatism, left eye: Secondary | ICD-10-CM | POA: Diagnosis not present

## 2022-10-09 DIAGNOSIS — H11002 Unspecified pterygium of left eye: Secondary | ICD-10-CM | POA: Diagnosis not present

## 2022-10-09 DIAGNOSIS — H11001 Unspecified pterygium of right eye: Secondary | ICD-10-CM | POA: Diagnosis not present

## 2022-10-17 DIAGNOSIS — E119 Type 2 diabetes mellitus without complications: Secondary | ICD-10-CM | POA: Diagnosis not present

## 2022-10-17 DIAGNOSIS — Z0001 Encounter for general adult medical examination with abnormal findings: Secondary | ICD-10-CM | POA: Diagnosis not present

## 2022-10-17 DIAGNOSIS — E039 Hypothyroidism, unspecified: Secondary | ICD-10-CM | POA: Diagnosis not present

## 2022-10-17 DIAGNOSIS — Z79899 Other long term (current) drug therapy: Secondary | ICD-10-CM | POA: Diagnosis not present

## 2022-10-17 DIAGNOSIS — E7800 Pure hypercholesterolemia, unspecified: Secondary | ICD-10-CM | POA: Diagnosis not present

## 2022-11-03 DIAGNOSIS — E119 Type 2 diabetes mellitus without complications: Secondary | ICD-10-CM | POA: Diagnosis not present

## 2022-11-03 LAB — LAB REPORT - SCANNED
A1c: 6.2
Albumin, Urine POC: 0.7
Albumin/Creatinine Ratio, Urine, POC: 7
Creatinine, POC: 100 mg/dL
EGFR: 94

## 2022-12-12 DIAGNOSIS — H25013 Cortical age-related cataract, bilateral: Secondary | ICD-10-CM | POA: Diagnosis not present

## 2022-12-12 DIAGNOSIS — H2513 Age-related nuclear cataract, bilateral: Secondary | ICD-10-CM | POA: Diagnosis not present

## 2022-12-12 DIAGNOSIS — E119 Type 2 diabetes mellitus without complications: Secondary | ICD-10-CM | POA: Diagnosis not present

## 2022-12-12 DIAGNOSIS — H524 Presbyopia: Secondary | ICD-10-CM | POA: Diagnosis not present

## 2022-12-12 DIAGNOSIS — H52203 Unspecified astigmatism, bilateral: Secondary | ICD-10-CM | POA: Diagnosis not present

## 2022-12-19 DIAGNOSIS — Z23 Encounter for immunization: Secondary | ICD-10-CM | POA: Diagnosis not present

## 2022-12-19 DIAGNOSIS — M7660 Achilles tendinitis, unspecified leg: Secondary | ICD-10-CM | POA: Diagnosis not present

## 2022-12-19 DIAGNOSIS — E1136 Type 2 diabetes mellitus with diabetic cataract: Secondary | ICD-10-CM | POA: Diagnosis not present

## 2023-01-05 ENCOUNTER — Telehealth: Payer: Self-pay | Admitting: Cardiology

## 2023-01-05 NOTE — Telephone Encounter (Signed)
Labs received from Dr. Glendale Chard office and scanned into chart under Lab tab.  Will forward to Dr. Jacinto Halim for his review.

## 2023-01-05 NOTE — Telephone Encounter (Signed)
Patient's PCP office is calling stating the patient called them reporting Dr. Jacinto Halim is need his lab results. She reports she is sending over all the labs from 04/16/22 - 11/03/22 that they have due to [place] not having and lab results since 03/2022. Results are being faxed to [phone]. Please advise.

## 2023-01-08 ENCOUNTER — Encounter: Payer: Self-pay | Admitting: Cardiology

## 2023-01-08 ENCOUNTER — Ambulatory Visit: Payer: Medicare Other | Attending: Cardiology | Admitting: Cardiology

## 2023-01-08 VITALS — BP 122/70 | HR 44 | Ht 63.0 in | Wt 153.6 lb

## 2023-01-08 DIAGNOSIS — R9439 Abnormal result of other cardiovascular function study: Secondary | ICD-10-CM

## 2023-01-08 DIAGNOSIS — I10 Essential (primary) hypertension: Secondary | ICD-10-CM | POA: Diagnosis not present

## 2023-01-08 DIAGNOSIS — E782 Mixed hyperlipidemia: Secondary | ICD-10-CM

## 2023-01-08 DIAGNOSIS — R739 Hyperglycemia, unspecified: Secondary | ICD-10-CM

## 2023-01-08 MED ORDER — VALSARTAN 80 MG PO TABS: 80.0000 mg | ORAL_TABLET | Freq: Every day | ORAL | 3 refills | Status: DC

## 2023-01-08 MED ORDER — ROSUVASTATIN CALCIUM 20 MG PO TABS
20.0000 mg | ORAL_TABLET | Freq: Every day | ORAL | 3 refills | Status: AC
Start: 2023-01-08 — End: ?

## 2023-01-08 NOTE — Progress Notes (Signed)
 Cardiology Office Note:  .   Date:  01/08/2023  ID:  Arlyce Blank, DOB [birth year], MRN [id number] PCP: Regino Slater, MD  [facility] Providers Cardiologist:  Gordy Bergamo, MD   History of Present Illness: .   Thomas Banks is a 73 y.o. hypertension, hyperglycemia, hyperlipidemia, and hypothyroidism, I had seen him a year ago and with prior history of tobacco use, he underwent stress test which was intermediate risk due to abnormal EKG however nuclear perfusion was normal with normal LVEF.  Due to persistent chest pain, underwent coronary CT angiogram on 03/27/2022 revealing normal coronary arteries with a coronary calcium  score of 0.  Discussed the use of AI scribe software for clinical note transcription with the patient, who gave verbal consent to proceed.  History of Present Illness   The patient, with a history of hypertension managed with valsartan  and hyperlipidemia managed with Crestor  20mg , presents with concerns about his heart rate during exercise and at rest. He reports that his heart rate can go up to 160 beats per minute during exercise and down to 37 beats per minute at rest. He has been monitoring his heart rate regularly and has noticed these fluctuations. He also reports that he exercises regularly, running four to five miles daily. He has been doing this level of exercise for some time, having previously run up to ten miles daily. He also reports that he occasionally does weightlifting exercises at the [facility].  The patient's wife is also mentioned in the conversation. She has been losing weight with the help of a medication called Ozempic. The patient inquires about the possibility of using Ozempic himself, but the doctor advises against it.      Labs   Lab Results  Component Value Date   CHOL 153 03/13/2022   HDL 67 03/13/2022   LDLCALC 72 03/13/2022   TRIG 70 03/13/2022   Lab Results  Component Value Date   NA 142 03/21/2022   K 4.4 03/21/2022   CO2 22  03/21/2022   GLUCOSE 95 03/21/2022   BUN 10 03/21/2022   CREATININE 0.89 03/21/2022   CALCIUM  10.0 03/21/2022   EGFR 94.0 11/03/2022      Latest Ref Rng & Units 03/21/2022   10:28 AM  BMP  Glucose 70 - 99 mg/dL 95   BUN 8 - 27 mg/dL 10   Creatinine 9.23 - 1.27 mg/dL 9.10   BUN/Creat Ratio 10 - 24 11   Sodium 134 - 144 mmol/L 142   Potassium 3.5 - 5.2 mmol/L 4.4   Chloride 96 - 106 mmol/L 103   CO2 20 - 29 mmol/L 22   Calcium  8.6 - 10.2 mg/dL 89.9       Latest Ref Rng & Units 02/14/2022    9:02 AM  CBC  WBC 4.0 - 10.5 K/uL 6.0   Hemoglobin 13.0 - 17.0 g/dL 86.3   Hematocrit 60.9 - 52.0 % 41.8   Platelets 150 - 400 K/uL 280    External Labs:  Labs 11/03/2022:  A1c 6.2%.  TSH normal at 1.11.  Total cholesterol 166, triglycerides 73, HDL 69, LDL 83.  Hb 14.0/HCT 42.2, platelets 294.  Serum glucose 124, BUN 20, creatinine 0.84, EGFR 94 mL, potassium 4.0, LFTs normal.   Review of Systems  Cardiovascular:  Negative for chest pain, dyspnea on exertion and leg swelling.    Physical Exam:   VS:  BP 122/70   Pulse (!) 44   Ht 5' 3 (1.6 m)   Wt 153  lb 9.6 oz (69.7 kg)   SpO2 98%   BMI 27.21 kg/m    Wt Readings from Last 3 Encounters:  01/08/23 153 lb 9.6 oz (69.7 kg)  04/24/22 155 lb (70.3 kg)  03/12/22 157 lb (71.2 kg)     Physical Exam Neck:     Vascular: No carotid bruit or JVD.  Cardiovascular:     Rate and Rhythm: Normal rate and regular rhythm.     Pulses: Intact distal pulses.     Heart sounds: Normal heart sounds. No murmur heard.    No gallop.  Pulmonary:     Effort: Pulmonary effort is normal.     Breath sounds: Normal breath sounds.  Abdominal:     General: Bowel sounds are normal.     Palpations: Abdomen is soft.  Musculoskeletal:     Right lower leg: No edema.     Left lower leg: No edema.    Studies Reviewed: SABRA    Exercise tetrofosmin  stress test  11/21/2018: Patient exercised on the Bruce protocol. They exercised for a total of 9  minutes and 29 seconds, achieving approximately 10.94 METs. Baseline heart rate was measured at 64 bpm. A maximum heart rate of 158 beats per minute was achieved, which is 103% of maximum predicted heart rate. Peak EKG/ECG demonstrated normal sinus rhythm. 2 mm horizontal ST depression of the inferolateral leads, resolved at  2 minutes into recovery. During exercise the peak ECG revealed no arrhythmias. During exercise the patient developed no chest pain, and exhibited no symptoms. Exercise was terminated due to fatigue/weakness.  The calculated Duke Treadmill Score is -0.52. Normal left ventricular systolic function. Normal perfusion.  Stress LV EF: 62%.  Overall intermediate risk study due to abnormal stress EKG. Clinical correlation recommended. No previous exam available for comparison.   Echocardiogram 11/19/2018: Left ventricle cavity is normal in size. Mild concentric hypertrophy of the left ventricle. Normal LV systolic function with EF 62%. Normal global wall motion. Normal diastolic filling pattern.  Mild (Grade I) mitral regurgitation. Mild tricuspid regurgitation.  No evidence of pulmonary hypertension.  Coronary CTA 03/27/2022: Coronary calcium  score of 0.  Right dominant circulation, normal coronary arteries.   Ascending and descending thoracic aortic measurements are normal.  EKG:    EKG Interpretation Date/Time:  Thursday January 08 2023 09:23:28 EST Ventricular Rate:  44 PR Interval:  172 QRS Duration:  92 QT Interval:  460 QTC Calculation: 393 R Axis:   40  Text Interpretation: EKG 01/08/2023: Normal sinus rhythm/marked sinus bradycardia at rate of 44 bpm otherwise normal EKG.  Compared to 02/14/2022, no significant change including heart rate. Confirmed by Yissel Habermehl, Jagadeesh [phone]) on 01/08/2023 9:38:47 AM    EKG 03/12/2022: Sinus bradycardia at rate of 56 bpm, otherwise normal EKG.   Medications and allergies    Allergies  Allergen Reactions   Streptomycin       Current Outpatient Medications:    Cholecalciferol (VITAMIN D3) 125 MCG (5000 UT) TABS, Take 10,000 Units by mouth daily., Disp: , Rfl:    Cyanocobalamin  (VITAMIN B 12) 500 MCG TABS, Take 1 tablet by mouth daily. (Patient taking differently: Take 5,000 mcg by mouth daily.), Disp: 90 tablet, Rfl: 3   levothyroxine  (SYNTHROID ) 88 MCG tablet, Take 88 mcg by mouth daily before breakfast., Disp: , Rfl:    Multiple Vitamin (MULTIVITAMIN) capsule, Take 1 capsule by mouth daily., Disp: , Rfl:    Omega 3-6-9 CAPS, Take 1 capsule by mouth daily., Disp: , Rfl:  Turmeric (QC TUMERIC COMPLEX PO), Take by mouth. Powder - half spoonful, Disp: , Rfl:    rosuvastatin  (CRESTOR ) 20 MG tablet, Take 1 tablet (20 mg total) by mouth daily., Disp: 90 tablet, Rfl: 3   valsartan  (DIOVAN ) 80 MG tablet, Take 1 tablet (80 mg total) by mouth daily., Disp: 90 tablet, Rfl: 3   ASSESSMENT AND PLAN: .      ICD-10-CM   1. Abnormal stress ECG  R94.39 EKG 12-Lead    2. Primary hypertension  I10 EKG 12-Lead    valsartan  (DIOVAN ) 80 MG tablet    3. Mixed hyperlipidemia  E78.2 EKG 12-Lead    rosuvastatin  (CRESTOR ) 20 MG tablet    4. Hyperglycemia  R73.9 EKG 12-Lead      Assessment and Plan    Exercise and Heart Rate Regular exercise with jogging and walking. Heart rate increases appropriately with exercise and decreases at rest. No symptoms of concern. -Continue current exercise regimen. -Consider incorporating weightlifting twice a week for muscle bulk maintenance.  Hypertension Well controlled on Valsartan . -Continue Valsartan . Prescription refills sent to [facility].  Hyperlipidemia LDL less than 100 on Crestor  20mg .  Although he has hyper tension and prediabetes, his coronary calcium  score just 8 months ago was 0. -Continue Crestor  20mg . Prescription refills sent to [facility].  Pre-diabetes A1c 6.2, stable. -No medication needed. Continue current lifestyle modifications.  He is presently on  ARB.  General Health Maintenance -Continue current lifestyle and exercise regimen. -Follow-up as needed.   Signed,  Gordy Bergamo, MD, [facility] 01/08/2023, 6:14 PM [facility] [address] [place], [place] [address] Phone: [phone]. Fax:  [phone]

## 2023-01-08 NOTE — Patient Instructions (Signed)

## 2023-01-29 DIAGNOSIS — E119 Type 2 diabetes mellitus without complications: Secondary | ICD-10-CM | POA: Diagnosis not present

## 2023-02-03 ENCOUNTER — Encounter (INDEPENDENT_AMBULATORY_CARE_PROVIDER_SITE_OTHER): Payer: 59 | Admitting: Ophthalmology

## 2023-02-03 DIAGNOSIS — H25811 Combined forms of age-related cataract, right eye: Secondary | ICD-10-CM | POA: Diagnosis not present

## 2023-02-03 DIAGNOSIS — H33302 Unspecified retinal break, left eye: Secondary | ICD-10-CM

## 2023-02-04 DIAGNOSIS — E119 Type 2 diabetes mellitus without complications: Secondary | ICD-10-CM | POA: Diagnosis not present

## 2023-02-09 ENCOUNTER — Ambulatory Visit: Payer: 59 | Admitting: Cardiology

## 2023-02-16 ENCOUNTER — Encounter (INDEPENDENT_AMBULATORY_CARE_PROVIDER_SITE_OTHER): Payer: 59 | Admitting: Ophthalmology

## 2023-02-16 DIAGNOSIS — H33302 Unspecified retinal break, left eye: Secondary | ICD-10-CM

## 2023-03-02 DIAGNOSIS — E7800 Pure hypercholesterolemia, unspecified: Secondary | ICD-10-CM | POA: Diagnosis not present

## 2023-03-02 DIAGNOSIS — E119 Type 2 diabetes mellitus without complications: Secondary | ICD-10-CM | POA: Diagnosis not present

## 2023-03-02 DIAGNOSIS — H9190 Unspecified hearing loss, unspecified ear: Secondary | ICD-10-CM | POA: Diagnosis not present

## 2023-03-11 ENCOUNTER — Encounter (INDEPENDENT_AMBULATORY_CARE_PROVIDER_SITE_OTHER): Payer: Self-pay

## 2023-04-08 DIAGNOSIS — Z79899 Other long term (current) drug therapy: Secondary | ICD-10-CM | POA: Diagnosis not present

## 2023-04-08 DIAGNOSIS — R6889 Other general symptoms and signs: Secondary | ICD-10-CM | POA: Diagnosis not present

## 2023-04-08 DIAGNOSIS — E039 Hypothyroidism, unspecified: Secondary | ICD-10-CM | POA: Diagnosis not present

## 2023-04-08 DIAGNOSIS — E119 Type 2 diabetes mellitus without complications: Secondary | ICD-10-CM | POA: Diagnosis not present

## 2023-04-10 ENCOUNTER — Other Ambulatory Visit: Payer: Self-pay | Admitting: Family Medicine

## 2023-04-10 DIAGNOSIS — R6889 Other general symptoms and signs: Secondary | ICD-10-CM

## 2023-04-29 DIAGNOSIS — G4733 Obstructive sleep apnea (adult) (pediatric): Secondary | ICD-10-CM | POA: Diagnosis not present

## 2023-05-05 ENCOUNTER — Telehealth: Payer: Self-pay | Admitting: Cardiology

## 2023-05-05 DIAGNOSIS — R9431 Abnormal electrocardiogram [ECG] [EKG]: Secondary | ICD-10-CM

## 2023-05-05 DIAGNOSIS — R001 Bradycardia, unspecified: Secondary | ICD-10-CM

## 2023-05-05 NOTE — Telephone Encounter (Signed)
 Pt c/o Shortness Of Breath: STAT if SOB developed within the last 24 hours or pt is noticeably SOB on the phone  1. Are you currently SOB (can you hear that pt is SOB on the phone)? Not at this time- last night he was short of breath 2. How long have you been experiencing SOB? Last night 3. Are you SOB when sitting or when up moving around? Att night, when he gets ready to lay down-  4.  Are you currently experiencing any other symptoms? No other symptoms- patient wants to see his Cardiologist

## 2023-05-05 NOTE — Telephone Encounter (Signed)
 Please schedule for a GXT prior to OV. Patient is aware. Indication Abnormal EKG, Bradycardia

## 2023-05-05 NOTE — Telephone Encounter (Signed)
 The patient reports that he woke up to use the bathroom last night and experienced shortness of breath and noticed his heart rate was also low= 38. This is the first and only time this has happened. He denies any symptoms at this time. He does not want to be seen by an APP only by Dr Berry Bristol. Appt made for 06/29/23 and he has been placed on a cancellation list.  ER precautions given. He verbalized understanding.

## 2023-05-07 ENCOUNTER — Encounter (INDEPENDENT_AMBULATORY_CARE_PROVIDER_SITE_OTHER): Admitting: Ophthalmology

## 2023-05-07 DIAGNOSIS — H353132 Nonexudative age-related macular degeneration, bilateral, intermediate dry stage: Secondary | ICD-10-CM

## 2023-05-07 DIAGNOSIS — E039 Hypothyroidism, unspecified: Secondary | ICD-10-CM | POA: Diagnosis not present

## 2023-05-07 DIAGNOSIS — E119 Type 2 diabetes mellitus without complications: Secondary | ICD-10-CM | POA: Diagnosis not present

## 2023-05-07 DIAGNOSIS — H43813 Vitreous degeneration, bilateral: Secondary | ICD-10-CM | POA: Diagnosis not present

## 2023-05-07 DIAGNOSIS — Z79899 Other long term (current) drug therapy: Secondary | ICD-10-CM | POA: Diagnosis not present

## 2023-05-07 DIAGNOSIS — H33302 Unspecified retinal break, left eye: Secondary | ICD-10-CM | POA: Diagnosis not present

## 2023-05-07 DIAGNOSIS — H905 Unspecified sensorineural hearing loss: Secondary | ICD-10-CM | POA: Diagnosis not present

## 2023-05-07 NOTE — Telephone Encounter (Signed)
 ICD-10-CM   1. Abnormal EKG  R94.31 EXERCISE TOLERANCE TEST (ETT)    Cardiac Stress Test: Informed Consent Details: Physician/Practitioner Attestation; Transcribe to consent form and obtain patient signature    2. Bradycardia  R00.1 EXERCISE TOLERANCE TEST (ETT)    Cardiac Stress Test: Informed Consent Details: Physician/Practitioner Attestation; Transcribe to consent form and obtain patient signature

## 2023-05-08 ENCOUNTER — Telehealth (HOSPITAL_COMMUNITY): Payer: Self-pay | Admitting: *Deleted

## 2023-05-08 ENCOUNTER — Encounter (HOSPITAL_COMMUNITY): Payer: Self-pay

## 2023-05-08 NOTE — Telephone Encounter (Signed)
 I sent patient detailed instructions about his ETT through my chart. I also called and left a message with the correct date for the test which is 05/15/23 and not 05/14/23.

## 2023-05-11 DIAGNOSIS — H25813 Combined forms of age-related cataract, bilateral: Secondary | ICD-10-CM | POA: Diagnosis not present

## 2023-05-11 DIAGNOSIS — H33302 Unspecified retinal break, left eye: Secondary | ICD-10-CM | POA: Diagnosis not present

## 2023-05-15 ENCOUNTER — Ambulatory Visit (HOSPITAL_COMMUNITY): Attending: Cardiology

## 2023-05-15 DIAGNOSIS — R9431 Abnormal electrocardiogram [ECG] [EKG]: Secondary | ICD-10-CM

## 2023-05-15 DIAGNOSIS — R001 Bradycardia, unspecified: Secondary | ICD-10-CM | POA: Diagnosis not present

## 2023-05-18 NOTE — Progress Notes (Unsigned)
 Cardiology Office Note:  .   Date:  05/19/2023  ID:  Thomas Banks, DOB [birth year], MRN [id number] PCP: Lanae Pinal, MD  [facility] Providers Cardiologist:  Knox Perl, MD   History of Present Illness: .   Thomas Banks is a 73 y.o. hypertension, hyperglycemia, hyperlipidemia, and hypothyroidism, coronary CT angiogram on 03/27/2022 revealing normal coronary arteries with a coronary calcium  score of 0. He is being seen for bradycardia.   Discussed the use of AI scribe software for clinical note transcription with the patient, who gave verbal consent to proceed.  History of Present Illness Thomas Banks is a 73 year old male with bradycardia who presents with concerns about low heart rate episodes.  He experiences bradycardia episodes with heart rates dropping to 35-36 bpm, especially at night. On April 30, he had seven episodes in one night, which was concerning. Typically, he has one or two episodes per night. He uses a CPAP machine for sleep apnea.  He maintains an active lifestyle, engaging in walking, swimming, weightlifting, pull-ups, and push-ups for about 45 minutes three times a week. He experiences no shortness of breath, dizziness, or fatigue during physical activities.   A home care visit noted a potential blockage in his legs with an ABI of 0.66, but he has not experienced leg pain or swelling.   Labs   Lab Results  Component Value Date   CHOL 153 03/13/2022   HDL 67 03/13/2022   LDLCALC 72 03/13/2022   TRIG 70 03/13/2022   Lab Results  Component Value Date   NA 142 03/21/2022   K 4.4 03/21/2022   CO2 22 03/21/2022   GLUCOSE 95 03/21/2022   BUN 10 03/21/2022   CREATININE 0.89 03/21/2022   CALCIUM  10.0 03/21/2022   EGFR 94.0 11/03/2022      Latest Ref Rng & Units 03/21/2022   10:28 AM  BMP  Glucose 70 - 99 mg/dL 95   BUN 8 - 27 mg/dL 10   Creatinine 5.28 - 1.27 mg/dL 4.13   BUN/Creat Ratio 10 - 24 11   Sodium 134 - 144 mmol/L 142   Potassium 3.5 -  5.2 mmol/L 4.4   Chloride 96 - 106 mmol/L 103   CO2 20 - 29 mmol/L 22   Calcium  8.6 - 10.2 mg/dL 24.4       Latest Ref Rng & Units 02/14/2022    9:02 AM  CBC  WBC 4.0 - 10.5 K/uL 6.0   Hemoglobin 13.0 - 17.0 g/dL 01.0   Hematocrit 27.2 - 52.0 % 41.8   Platelets 150 - 400 K/uL 280    No results found for: "HGBA1C"  Lab Results  Component Value Date   TSH 0.536 03/13/2022     PCP labs on Care Everywhere 05/18/2023:  TSH normal at 1.72.  Serum glucose 91 mg, BUN 16, creatinine 0.94, EGFR 87 mL, potassium 4.3, sodium 140, LFTs normal.  A1c 6.3%.  ROS  Review of Systems  Cardiovascular:  Negative for chest pain, dyspnea on exertion and leg swelling.  Respiratory:  Positive for snoring (on cpap).     Physical Exam:   VS:  BP 123/66 (BP Location: Left Arm, Patient Position: Sitting, Cuff Size: Normal)   Pulse (!) 49   Resp 16   Ht 5\' 3"  (1.6 m)   Wt 154 lb (69.9 kg)   SpO2 97%   BMI 27.28 kg/m    Wt Readings from Last 3 Encounters:  05/19/23 154 lb (69.9 kg)  01/08/23 153  lb 9.6 oz (69.7 kg)  04/24/22 155 lb (70.3 kg)    Physical Exam Neck:     Vascular: No carotid bruit or JVD.  Cardiovascular:     Rate and Rhythm: Regular rhythm. Bradycardia present.     Pulses: Intact distal pulses.     Heart sounds: Normal heart sounds. No murmur heard.    No gallop.  Pulmonary:     Effort: Pulmonary effort is normal.     Breath sounds: Normal breath sounds.  Abdominal:     General: Bowel sounds are normal.     Palpations: Abdomen is soft.  Musculoskeletal:     Right lower leg: No edema.     Left lower leg: No edema.    Studies Reviewed: Aaron Aas    Exercise tetrofosmin  stress test  11/21/2018: Patient exercised on the Bruce protocol. They exercised for a total of 9 minutes and 29 seconds, achieving approximately 10.94 METs. Baseline heart rate was measured at 64 bpm. A maximum heart rate of 158 beats per minute was achieved, which is 103% of maximum predicted heart rate. Peak  EKG/ECG demonstrated normal sinus rhythm. 2 mm horizontal ST depression of the inferolateral leads, resolved at  2 minutes into recovery. During exercise the peak ECG revealed no arrhythmias. During exercise the patient developed no chest pain, and exhibited no symptoms. Exercise was terminated due to fatigue/weakness.  The calculated Duke Treadmill Score is -0.52. Normal left ventricular systolic function. Normal perfusion.  Stress LV EF: 62%.  Overall intermediate risk study due to abnormal stress EKG. Clinical correlation recommended. No previous exam available for comparison.  Coronary CTA 03/27/2022: Coronary calcium  score of 0.  Right dominant circulation, normal coronary arteries.   Ascending and descending thoracic aortic measurements are normal.  EKG:    EKG Interpretation Date/Time:  Tuesday May 19 2023 09:22:09 EDT Ventricular Rate:  47 PR Interval:  172 QRS Duration:  78 QT Interval:  428 QTC Calculation: 378 R Axis:   3  Text Interpretation: EKG 05/19/2023: Sinus bradycardia at rate of 47 bpm otherwise normal EKG.  Compared to 01/08/2023, no change, previous heart rate was 44 bpm. Confirmed by Anjalee Cope, Jagadeesh ([id number]) on 05/19/2023 9:45:44 AM    Medications and allergies    Allergies  Allergen Reactions   Streptomycin      Current Outpatient Medications:    Cholecalciferol (VITAMIN D3) 125 MCG (5000 UT) TABS, Take 10,000 Units by mouth daily., Disp: , Rfl:    Cyanocobalamin  (VITAMIN B 12) 500 MCG TABS, Take 1 tablet by mouth daily. (Patient taking differently: Take 5,000 mcg by mouth daily.), Disp: 90 tablet, Rfl: 3   levothyroxine  (SYNTHROID ) 88 MCG tablet, Take 88 mcg by mouth daily before breakfast., Disp: , Rfl:    Multiple Vitamin (MULTIVITAMIN) capsule, Take 1 capsule by mouth daily., Disp: , Rfl:    Omega 3-6-9 CAPS, Take 1 capsule by mouth daily., Disp: , Rfl:    rosuvastatin  (CRESTOR ) 20 MG tablet, Take 1 tablet (20 mg total) by mouth daily., Disp: 90 tablet,  Rfl: 3   valsartan  (DIOVAN ) 80 MG tablet, Take 1 tablet (80 mg total) by mouth daily., Disp: 90 tablet, Rfl: 3   No orders of the defined types were placed in this encounter.    Medications Discontinued During This Encounter  Medication Reason   Turmeric (QC TUMERIC COMPLEX PO) Patient Preference     ASSESSMENT AND PLAN: .      ICD-10-CM   1. Bradycardia by electrocardiogram  R00.1 EKG 12-Lead  2. Primary hypertension  I10     3. OSA on CPAP  G47.33       Assessment and Plan Assessment & Plan Bradycardia   His heart rate drops to 35-36 bpm at night due to excellent physical fitness and regular exercise, including swimming and walking. The current heart rate is 44 bpm, which is healthy for his condition. He experiences no symptoms such as dizziness or fatigue. A previous coronary CT angiogram in 2024 showed zero blockage, indicating good cardiovascular health. Bradycardia is considered normal for his fitness level. Continue regular exercise regimen.  Hypertension, well-controlled   Hypertension is well-controlled with valsartan  80 mg once daily. Current blood pressure is 123/66 mmHg, indicating excellent control. Continue valsartan  80 mg once daily.  Sleep Apnea   Sleep apnea is managed with regular CPAP use, with no issues reported. Bradycardia at night is not attributed to untreated sleep apnea as CPAP is used consistently. Continue regular use of CPAP.  Normal ABI by home health visiting nurse He has bounding pulses in the lower extremity with no symptoms of claudication, blood pressure is well-controlled, lipids are under excellent control, no other further cardiovascular risk reduction can be performed in a person who has been exercising avidly and remains asymptomatic.  I will see him back on a as needed basis.  Signed,  Knox Perl, MD, [facility] 05/19/2023, 8:43 PM [facility] [address] [place], [place] [address] Phone: [phone]. Fax:  [phone]

## 2023-05-19 ENCOUNTER — Ambulatory Visit: Attending: Cardiology | Admitting: Cardiology

## 2023-05-19 ENCOUNTER — Encounter: Payer: Self-pay | Admitting: Cardiology

## 2023-05-19 VITALS — BP 123/66 | HR 49 | Resp 16 | Ht 63.0 in | Wt 154.0 lb

## 2023-05-19 DIAGNOSIS — I10 Essential (primary) hypertension: Secondary | ICD-10-CM | POA: Diagnosis not present

## 2023-05-19 DIAGNOSIS — R001 Bradycardia, unspecified: Secondary | ICD-10-CM | POA: Diagnosis not present

## 2023-05-19 DIAGNOSIS — G4733 Obstructive sleep apnea (adult) (pediatric): Secondary | ICD-10-CM

## 2023-05-19 NOTE — Patient Instructions (Signed)

## 2023-05-21 ENCOUNTER — Ambulatory Visit (INDEPENDENT_AMBULATORY_CARE_PROVIDER_SITE_OTHER): Admitting: Orthopaedic Surgery

## 2023-05-21 ENCOUNTER — Other Ambulatory Visit (INDEPENDENT_AMBULATORY_CARE_PROVIDER_SITE_OTHER): Payer: Self-pay

## 2023-05-21 DIAGNOSIS — G8929 Other chronic pain: Secondary | ICD-10-CM

## 2023-05-21 DIAGNOSIS — M25511 Pain in right shoulder: Secondary | ICD-10-CM

## 2023-05-21 DIAGNOSIS — M79672 Pain in left foot: Secondary | ICD-10-CM

## 2023-05-21 MED ORDER — CELECOXIB 200 MG PO CAPS: 200.0000 mg | ORAL_CAPSULE | Freq: Two times a day (BID) | ORAL | 1 refills | Status: DC | PRN

## 2023-05-21 MED ORDER — LIDOCAINE HCL 1 % IJ SOLN
3.0000 mL | INTRAMUSCULAR | Status: AC | PRN
Start: 2023-05-21 — End: 2023-05-21
  Administered 2023-05-21: 3 mL

## 2023-05-21 MED ORDER — METHYLPREDNISOLONE ACETATE 40 MG/ML IJ SUSP
40.0000 mg | INTRAMUSCULAR | Status: AC | PRN
Start: 2023-05-21 — End: 2023-05-21
  Administered 2023-05-21: 40 mg via INTRA_ARTICULAR

## 2023-05-21 NOTE — Progress Notes (Signed)
 The patient is an active 73 year old gentleman who actually is across the street from them.  He has been complaining of right shoulder pain for about a month now with no known injury.  It does radiate some down into his wrist and it wakes him up at night.  He denies any neck pain.  He is also been having some left foot pain mainly with walking.  It does not hurt first thing in the morning and he points to the lateral aspect of his foot and the bottom of his foot as a source of his pain.  He is very active.  He does walk quite a bit in the neighborhood.  He is a diabetic but his hemoglobin A1c is in the low 6 range.  Examination of his right shoulder does show signs of impingement but the rotator cuff itself feels strong.  He has excellent range of motion of the shoulder which is full.  There is some slight weakness on external rotation.   3 views of the right shoulder show a slight decrease in the subacromial outlet.  The glenohumeral joint is well located with no significant arthritic changes.  The [facility] joint shows minimal arthritic changes.  There are some cystic changes in the footprint of the greater tuberosity where the rotator cuff attaches.  Examination of his left foot shows no gross deformities.  There is pain along the fifth ray on the bottom of the foot.  His foot is slightly widened the arch is slightly less but not truly a flatfoot.  His heel cord is not tight.  He does not have pain over the plantar fascia.  3 views of the left foot show no gross deformities or malalignment.  From my standpoint I would like to start him on Celebrex as an anti-inflammatory.  I want him to work on shoe wear and making sure he has firmer fitting shoes.  I also recommended a steroid injection in the right shoulder subacromial outlet which he agreed to and tolerated well.  I did describe the risk and benefits of steroid injections.  Will can see him back in 6 weeks to see how he is doing overall.  He understands that  he can flag me down in the neighborhood anytime as well since I see him often walking.    Procedure Note  Patient: Thomas Banks             Date of Birth: Aug 20, 1950           MRN: [id number]             Visit Date: 05/21/2023  Procedures: Visit Diagnoses:  1. Chronic right shoulder pain   2. Left foot pain     Large Joint Inj: R subacromial bursa on 05/21/2023 1:08 PM Indications: pain and diagnostic evaluation Details: 22 G 1.5 in needle  Arthrogram: No  Medications: 3 mL lidocaine  1 %; 40 mg methylPREDNISolone acetate 40 MG/ML Outcome: tolerated well, no immediate complications Procedure, treatment alternatives, risks and benefits explained, specific risks discussed. Consent was given by the patient. Immediately prior to procedure a time out was called to verify the correct patient, procedure, equipment, support staff and site/side marked as required. Patient was prepped and draped in the usual sterile fashion.

## 2023-05-22 DIAGNOSIS — H2512 Age-related nuclear cataract, left eye: Secondary | ICD-10-CM | POA: Diagnosis not present

## 2023-05-22 DIAGNOSIS — H25812 Combined forms of age-related cataract, left eye: Secondary | ICD-10-CM | POA: Diagnosis not present

## 2023-05-22 DIAGNOSIS — H52222 Regular astigmatism, left eye: Secondary | ICD-10-CM | POA: Diagnosis not present

## 2023-05-28 ENCOUNTER — Ambulatory Visit: Payer: Self-pay | Admitting: Cardiology

## 2023-05-28 ENCOUNTER — Ambulatory Visit: Admitting: Physician Assistant

## 2023-05-28 LAB — EXERCISE TOLERANCE TEST
Angina Index: 0
Duke Treadmill Score: 10
Estimated workload: 11.8
Exercise duration (min): 10 min
Exercise duration (sec): 3 s
MPHR: 150 {beats}/min
Peak HR: 141 {beats}/min
Percent HR: 94 %
Rest HR: 51 {beats}/min
ST Depression (mm): 0 mm

## 2023-05-28 NOTE — Progress Notes (Signed)
 Treadmill Exercise Stress 05/28/2023: Patient exercised for 10 minutes on Bruce protocol and achieved 11.8 METS and 94% of MPHR.   No ST deviation was noted. Arrhythmias during recovery: rare PVCs. ECG was interpretable and conclusive. The ECG was negative for ischemia. Negative adequate stress test without evidence of ischemia at given workload.  Excellent exercise capacity, heart rate went up as appropriate.

## 2023-05-29 NOTE — Progress Notes (Signed)
 I have discussed the results with the patient and reassured him

## 2023-06-10 DIAGNOSIS — G4733 Obstructive sleep apnea (adult) (pediatric): Secondary | ICD-10-CM | POA: Diagnosis not present

## 2023-06-15 ENCOUNTER — Encounter (INDEPENDENT_AMBULATORY_CARE_PROVIDER_SITE_OTHER): Payer: 59 | Admitting: Ophthalmology

## 2023-06-15 DIAGNOSIS — H33302 Unspecified retinal break, left eye: Secondary | ICD-10-CM | POA: Diagnosis not present

## 2023-06-15 DIAGNOSIS — H43812 Vitreous degeneration, left eye: Secondary | ICD-10-CM | POA: Diagnosis not present

## 2023-06-15 DIAGNOSIS — H353122 Nonexudative age-related macular degeneration, left eye, intermediate dry stage: Secondary | ICD-10-CM | POA: Diagnosis not present

## 2023-06-15 DIAGNOSIS — H353111 Nonexudative age-related macular degeneration, right eye, early dry stage: Secondary | ICD-10-CM

## 2023-06-17 ENCOUNTER — Ambulatory Visit: Admitting: Orthopaedic Surgery

## 2023-06-23 DIAGNOSIS — R001 Bradycardia, unspecified: Secondary | ICD-10-CM | POA: Diagnosis not present

## 2023-06-23 DIAGNOSIS — R7303 Prediabetes: Secondary | ICD-10-CM | POA: Diagnosis not present

## 2023-06-24 ENCOUNTER — Other Ambulatory Visit

## 2023-06-24 DIAGNOSIS — I10 Essential (primary) hypertension: Secondary | ICD-10-CM | POA: Diagnosis not present

## 2023-06-24 DIAGNOSIS — G4733 Obstructive sleep apnea (adult) (pediatric): Secondary | ICD-10-CM | POA: Diagnosis not present

## 2023-06-24 DIAGNOSIS — E7800 Pure hypercholesterolemia, unspecified: Secondary | ICD-10-CM | POA: Diagnosis not present

## 2023-06-24 DIAGNOSIS — R7303 Prediabetes: Secondary | ICD-10-CM | POA: Diagnosis not present

## 2023-06-25 ENCOUNTER — Ambulatory Visit
Admission: RE | Admit: 2023-06-25 | Discharge: 2023-06-25 | Discharge status: Home / Self Care | Source: Ambulatory Visit | Attending: Family Medicine | Admitting: Family Medicine

## 2023-06-25 DIAGNOSIS — H2511 Age-related nuclear cataract, right eye: Secondary | ICD-10-CM | POA: Diagnosis not present

## 2023-06-25 DIAGNOSIS — H25011 Cortical age-related cataract, right eye: Secondary | ICD-10-CM | POA: Diagnosis not present

## 2023-06-25 DIAGNOSIS — I70213 Atherosclerosis of native arteries of extremities with intermittent claudication, bilateral legs: Secondary | ICD-10-CM | POA: Diagnosis not present

## 2023-06-25 DIAGNOSIS — H31002 Unspecified chorioretinal scars, left eye: Secondary | ICD-10-CM | POA: Diagnosis not present

## 2023-06-25 DIAGNOSIS — R6889 Other general symptoms and signs: Secondary | ICD-10-CM

## 2023-06-25 DIAGNOSIS — H1789 Other corneal scars and opacities: Secondary | ICD-10-CM | POA: Diagnosis not present

## 2023-06-29 ENCOUNTER — Ambulatory Visit: Admitting: Cardiology

## 2023-07-01 DIAGNOSIS — R001 Bradycardia, unspecified: Secondary | ICD-10-CM | POA: Diagnosis not present

## 2023-07-01 DIAGNOSIS — G4733 Obstructive sleep apnea (adult) (pediatric): Secondary | ICD-10-CM | POA: Diagnosis not present

## 2023-07-02 ENCOUNTER — Ambulatory Visit: Admitting: Orthopaedic Surgery

## 2023-07-07 DIAGNOSIS — H25811 Combined forms of age-related cataract, right eye: Secondary | ICD-10-CM | POA: Diagnosis not present

## 2023-07-09 DIAGNOSIS — G4733 Obstructive sleep apnea (adult) (pediatric): Secondary | ICD-10-CM | POA: Diagnosis not present

## 2023-07-09 DIAGNOSIS — E7800 Pure hypercholesterolemia, unspecified: Secondary | ICD-10-CM | POA: Diagnosis not present

## 2023-07-09 DIAGNOSIS — I10 Essential (primary) hypertension: Secondary | ICD-10-CM | POA: Diagnosis not present

## 2023-07-09 DIAGNOSIS — R7303 Prediabetes: Secondary | ICD-10-CM | POA: Diagnosis not present

## 2023-07-31 DIAGNOSIS — H9190 Unspecified hearing loss, unspecified ear: Secondary | ICD-10-CM | POA: Diagnosis not present

## 2023-08-18 DIAGNOSIS — I10 Essential (primary) hypertension: Secondary | ICD-10-CM | POA: Diagnosis not present

## 2023-08-18 DIAGNOSIS — R7303 Prediabetes: Secondary | ICD-10-CM | POA: Diagnosis not present

## 2023-08-31 DIAGNOSIS — G4733 Obstructive sleep apnea (adult) (pediatric): Secondary | ICD-10-CM | POA: Diagnosis not present

## 2023-08-31 DIAGNOSIS — R7303 Prediabetes: Secondary | ICD-10-CM | POA: Diagnosis not present

## 2023-08-31 DIAGNOSIS — E538 Deficiency of other specified B group vitamins: Secondary | ICD-10-CM | POA: Diagnosis not present

## 2023-08-31 DIAGNOSIS — I10 Essential (primary) hypertension: Secondary | ICD-10-CM | POA: Diagnosis not present

## 2023-08-31 DIAGNOSIS — E559 Vitamin D deficiency, unspecified: Secondary | ICD-10-CM | POA: Diagnosis not present

## 2023-08-31 DIAGNOSIS — I495 Sick sinus syndrome: Secondary | ICD-10-CM | POA: Diagnosis not present

## 2023-09-01 DIAGNOSIS — E538 Deficiency of other specified B group vitamins: Secondary | ICD-10-CM | POA: Diagnosis not present

## 2023-09-01 DIAGNOSIS — G4733 Obstructive sleep apnea (adult) (pediatric): Secondary | ICD-10-CM | POA: Diagnosis not present

## 2023-09-01 DIAGNOSIS — I495 Sick sinus syndrome: Secondary | ICD-10-CM | POA: Diagnosis not present

## 2023-09-01 DIAGNOSIS — E559 Vitamin D deficiency, unspecified: Secondary | ICD-10-CM | POA: Diagnosis not present

## 2023-09-01 DIAGNOSIS — R7303 Prediabetes: Secondary | ICD-10-CM | POA: Diagnosis not present

## 2023-09-16 DIAGNOSIS — Z0000 Encounter for general adult medical examination without abnormal findings: Secondary | ICD-10-CM | POA: Diagnosis not present

## 2023-09-16 DIAGNOSIS — E039 Hypothyroidism, unspecified: Secondary | ICD-10-CM | POA: Diagnosis not present

## 2023-09-16 DIAGNOSIS — I10 Essential (primary) hypertension: Secondary | ICD-10-CM | POA: Diagnosis not present

## 2023-09-16 DIAGNOSIS — E611 Iron deficiency: Secondary | ICD-10-CM | POA: Diagnosis not present

## 2023-09-16 DIAGNOSIS — E7800 Pure hypercholesterolemia, unspecified: Secondary | ICD-10-CM | POA: Diagnosis not present

## 2023-09-16 DIAGNOSIS — Z23 Encounter for immunization: Secondary | ICD-10-CM | POA: Diagnosis not present

## 2023-09-16 DIAGNOSIS — R7303 Prediabetes: Secondary | ICD-10-CM | POA: Diagnosis not present

## 2023-09-17 DIAGNOSIS — H903 Sensorineural hearing loss, bilateral: Secondary | ICD-10-CM | POA: Diagnosis not present

## 2023-09-20 ENCOUNTER — Other Ambulatory Visit: Payer: Self-pay | Admitting: Cardiology

## 2023-09-20 DIAGNOSIS — E782 Mixed hyperlipidemia: Secondary | ICD-10-CM

## 2023-09-23 ENCOUNTER — Ambulatory Visit

## 2023-09-23 ENCOUNTER — Ambulatory Visit: Attending: General Practice | Admitting: General Practice

## 2023-09-23 ENCOUNTER — Encounter: Payer: Self-pay | Admitting: General Practice

## 2023-09-23 VITALS — BP 132/64 | HR 39 | Ht 63.0 in | Wt 159.0 lb

## 2023-09-23 DIAGNOSIS — R001 Bradycardia, unspecified: Secondary | ICD-10-CM

## 2023-09-23 DIAGNOSIS — R5383 Other fatigue: Secondary | ICD-10-CM

## 2023-09-23 DIAGNOSIS — I10 Essential (primary) hypertension: Secondary | ICD-10-CM | POA: Diagnosis not present

## 2023-09-23 DIAGNOSIS — G4733 Obstructive sleep apnea (adult) (pediatric): Secondary | ICD-10-CM

## 2023-09-23 NOTE — Patient Instructions (Signed)
 Medication Instructions:  Your physician recommends that you continue on your current medications as directed. Please refer to the Current Medication list given to you today.  *If you need a refill on your cardiac medications before your next appointment, please call your pharmacy*  Lab Work: TODAY: BMET, CBC, THYROID  PANEL WITH TSH If you have labs (blood work) drawn today and your tests are completely normal, you will receive your results only by: MyChart Message (if you have MyChart) OR A paper copy in the mail If you have any lab test that is abnormal or we need to change your treatment, we will call you to review the results.  Testing/Procedures: Your physician has requested that you have an echocardiogram. Echocardiography is a painless test that uses sound waves to create images of your heart. It provides your doctor with information about the size and shape of your heart and how well your heart's chambers and valves are working. This procedure takes approximately one hour. There are no restrictions for this procedure. Please do NOT wear cologne, perfume, aftershave, or lotions (deodorant is allowed). Please arrive 15 minutes prior to your appointment time.  Please note: We ask at that you not bring children with you during ultrasound (echo/ vascular) testing. Due to room size and safety concerns, children are not allowed in the ultrasound rooms during exams. Our front office staff cannot provide observation of children in our lobby area while testing is being conducted. An adult accompanying a patient to their appointment will only be allowed in the ultrasound room at the discretion of the ultrasound technician under special circumstances. We apologize for any inconvenience.   ZIO XT- Long Term Monitor Instructions  Your physician has requested you wear a ZIO patch monitor for 7 days.  This is a single patch monitor. [employer] supplies one patch monitor per enrollment. Additional stickers  are not available. Please do not apply patch if you will be having a Nuclear Stress Test,  Echocardiogram, Cardiac CT, MRI, or Chest Xray during the period you would be wearing the  monitor. The patch cannot be worn during these tests. You cannot remove and re-apply the  ZIO XT patch monitor.  Your ZIO patch monitor will be mailed 3 day USPS to your address on file. It may take 3-5 days  to receive your monitor after you have been enrolled.  Once you have received your monitor, please review the enclosed instructions. Your monitor  has already been registered assigning a specific monitor serial # to you.  Billing and Patient Assistance Program Information  We have supplied [employer] with any of your insurance information on file for billing purposes. [employer] offers a sliding scale Patient Assistance Program for patients that do not have  insurance, or whose insurance does not completely cover the cost of the ZIO monitor.  You must apply for the Patient Assistance Program to qualify for this discounted rate.  To apply, please call [employer] at [phone], select option 4, select option 2, ask to apply for  Patient Assistance Program. Meredeth will ask your household income, and how many people  are in your household. They will quote your out-of-pocket cost based on that information.  [employer] will also be able to set up a 4-month, interest-free payment plan if needed.  Applying the monitor   Shave hair from upper left chest.  Hold abrader disc by orange tab. Rub abrader in 40 strokes over the upper left chest as  indicated in your monitor instructions.  Clean area  with 4 enclosed alcohol pads. Let dry.  Apply patch as indicated in monitor instructions. Patch will be placed under collarbone on left  side of chest with arrow pointing upward.  Rub patch adhesive wings for 2 minutes. Remove white label marked 1. Remove the white  label marked 2. Rub patch adhesive wings for 2 additional  minutes.  While looking in a mirror, press and release button in center of patch. A small green light will  flash 3-4 times. This will be your only indicator that the monitor has been turned on.  Do not shower for the first 24 hours. You may shower after the first 24 hours.  Press the button if you feel a symptom. You will hear a small click. Record Date, Time and  Symptom in the Patient Logbook.  When you are ready to remove the patch, follow instructions on the last 2 pages of Patient  Logbook. Stick patch monitor onto the last page of Patient Logbook.  Place Patient Logbook in the blue and white box. Use locking tab on box and tape box closed  securely. The blue and white box has prepaid postage on it. Please place it in the mailbox as  soon as possible. Your physician should have your test results approximately 7 days after the  monitor has been mailed back to [facility].  Call [facility] Customer Care at [phone] if you have questions regarding  your ZIO XT patch monitor. Call them immediately if you see an orange light blinking on your  monitor.  If your monitor falls off in less than 4 days, contact our Monitor department at [phone].  If your monitor becomes loose or falls off after 4 days call [employer] at [phone] for  suggestions on securing your monitor  Follow-Up: At [facility], you and your health needs are our priority.  As part of our continuing mission to provide you with exceptional heart care, our providers are all part of one team.  This team includes your primary Cardiologist (physician) and Advanced Practice Providers or APPs (Physician Assistants and Nurse Practitioners) who all work together to provide you with the care you need, when you need it.  Your next appointment:   9-12 month(s)  Provider:   Gordy Bergamo, MD or Josefa Beauvais, NP     YOU HAVE BEEN REFERRED TO SEE CARDIAC ELECTROPHYSIOLOGY

## 2023-09-23 NOTE — Progress Notes (Unsigned)
 Applied a 7 day Zio XT monitor to patient in the office  Ganji to read

## 2023-09-23 NOTE — Progress Notes (Signed)
 Cardiology Clinic Note   Patient Name: Thomas Banks Date of Encounter: 09/23/2023  Primary Care Provider:  Regino Slater, MD Primary Cardiologist:  Gordy Bergamo, MD  Patient Profile    Thomas Banks 73 year old male presents to the clinic today for follow-up evaluation of his hypertension, bradycardia, and hyperlipidemia.  Past Medical History    Past Medical History:  Diagnosis Date   Hyperlipidemia    Hypertension    Hypothyroidism    Sleep apnea    uses Cpap   Thyroid  disease    Past Surgical History:  Procedure Laterality Date   COLONOSCOPY     DRUG INDUCED ENDOSCOPY     NASAL SEPTOPLASTY W/ TURBINOPLASTY Bilateral 02/14/2022   Procedure: NASAL SEPTOPLASTY WITH TURBINATE REDUCTION;  Surgeon: Llewellyn Gerard LABOR, DO;  Location: [facility] OR;  Service: ENT;  Laterality: Bilateral;    Allergies  Allergies  Allergen Reactions   Streptomycin     History of Present Illness    Thomas Banks has PMH of HTN, HLD, hyperglycemia, hypothyroidism, bradycardia, and underwent coronary CT 3/24 which showed normal coronary arteries and a coronary calcium  score of 0.  He had a home care visit that noted potential blockage in his legs.  He had ABIs which showed a value of 0.66.  He had stress testing 11/20.  He was able to exercise for 9 minutes and 29 seconds.  He was able to achieve 10.94 METS.  His baseline heart rate was 64.  Maximum heart rate was 150 bpm.  Peak EKG showed normal sinus rhythm.  He was noted to have 2 mm horizontal ST depression of the inferior lateral leads.  This resolved with recovery.  No arrhythmias were noted.  LVEF was noted to be 62%.  Overall stress test was intermediate due to abnormal stress EKG.  He was seen and evaluated by Dr. Bergamo on 05/19/2023 for bradycardia.  He reported low heart rate episodes.  He noted that his heart rate would drop to 35-36 on occasion at night.  On 05/06/2023 he had 7 episodes in 1 night.  This was concerning to him.  Typically he was  having 1 or 2 episodes per night.  He was using a CPAP machine for sleep apnea.  He reported that he was active and enjoyed walking, swimming, weightlifting, and calisthenic type exercises.  He was doing these for 45 minutes 3 times per week.  He denied shortness of breath, dizziness, and fatigue during these activities.  He denied leg pain and lower extremity edema.  He presents to the clinic today for follow-up evaluation and states he is very concerned about his low heart rate.  He shows his Apple watch has been reporting over 45 episodes of heart rate in the 30s over the last few nights.  He contacted his PCP and was directed to our office for follow-up.  His blood pressure today initially is 140/66 and on recheck is 132/64.  I reassured him that his blood pressure is within normal limits.  His EKG today shows sinus bradycardia with a rate of 39.  He does note some increased fatigue.  He notes that he has not been exercising until the last few days.  He typically exercises 2 to 3 days/week doing cycling and weightlifting type exercises.  I will order CBC, BMP, TSH, T3-4, 7-day cardiac event monitor, echocardiogram, and plan referral to EP.  He reports compliance with his CPAP.  Today he denies chest pain,  lower extremity edema, palpitations, melena, hematuria, hemoptysis,  diaphoresis, weakness, presyncope, syncope, orthopnea, and PND.   Home Medications    Prior to Admission medications   Medication Sig Start Date End Date Taking? Authorizing Provider  celecoxib  (CELEBREX ) 200 MG capsule Take 1 capsule (200 mg total) by mouth 2 (two) times daily between meals as needed. 05/21/23   Vernetta Lonni GRADE, MD  Cholecalciferol (VITAMIN D3) 125 MCG (5000 UT) TABS Take 10,000 Units by mouth daily.    [provider]  Cyanocobalamin  (VITAMIN B 12) 500 MCG TABS Take 1 tablet by mouth daily. Patient taking differently: Take 5,000 mcg by mouth daily. 04/05/19   Ladona Heinz, MD  levothyroxine   (SYNTHROID ) 88 MCG tablet Take 88 mcg by mouth daily before breakfast.    [provider]  Multiple Vitamin (MULTIVITAMIN) capsule Take 1 capsule by mouth daily.    [provider]  Omega 3-6-9 CAPS Take 1 capsule by mouth daily.    [provider]  rosuvastatin  (CRESTOR ) 20 MG tablet Take 1 tablet (20 mg total) by mouth daily. 01/08/23   Ladona Heinz, MD  valsartan  (DIOVAN ) 80 MG tablet Take 1 tablet (80 mg total) by mouth daily. 01/08/23   Ladona Heinz, MD    Family History    Family History  Problem Relation Age of Onset   Tongue cancer Father 42       smoked   He indicated that his mother is deceased. He indicated that his father is deceased. He indicated that his sister is alive. He indicated that his brother is alive.  Social History    Social History   Socioeconomic History   Marital status: Single    Spouse name: Not on file   Number of children: 3   Years of education: Not on file   Highest education level: Not on file  Occupational History   Not on file  Tobacco Use   Smoking status: Former    Current packs/day: 0.00    Average packs/day: 0.3 packs/day for 25.0 years (6.3 ttl pk-yrs)    Types: Cigarettes    Start date: 55    Quit date: 2000    Years since quitting: 25.7   Smokeless tobacco: Never  Vaping Use   Vaping status: Never Used  Substance and Sexual Activity   Alcohol use: Not Currently   Drug use: Never   Sexual activity: Not on file  Other Topics Concern   Not on file  Social History Narrative   Not on file   Social Drivers of Health   Financial Resource Strain: Not on file  Food Insecurity: No Food Insecurity (08/08/2021)   Hunger Vital Sign    Worried About Running Out of Food in the Last Year: Never true    Ran Out of Food in the Last Year: Never true  Transportation Needs: No Transportation Needs (08/08/2021)   PRAPARE - Administrator, Civil Service (Medical): No    Lack of Transportation (Non-Medical): No   Physical Activity: Not on file  Stress: Not on file  Social Connections: Not on file  Intimate Partner Violence: Not on file     Review of Systems    General:  No chills, fever, night sweats or weight changes.  Cardiovascular:  No chest pain, dyspnea on exertion, edema, orthopnea, palpitations, paroxysmal nocturnal dyspnea. Dermatological: No rash, lesions/masses Respiratory: No cough, dyspnea Urologic: No hematuria, dysuria Abdominal:   No nausea, vomiting, diarrhea, bright red blood per rectum, melena, or hematemesis Neurologic:  No visual changes, wkns, changes  in mental status. All other systems reviewed and are otherwise negative except as noted above.  Physical Exam    VS:  BP 132/64   Pulse (!) 39   Ht 5' 3 (1.6 m)   Wt 159 lb (72.1 kg)   SpO2 99%   BMI 28.17 kg/m  , BMI Body mass index is 28.17 kg/m. GEN: Well nourished, well developed, in no acute distress. HEENT: normal. Neck: Supple, no JVD, carotid bruits, or masses. Cardiac: RRR, no murmurs, rubs, or gallops. No clubbing, cyanosis, edema.  Radials/DP/PT 2+ and equal bilaterally.  Respiratory:  Respirations regular and unlabored, clear to auscultation bilaterally. GI: Soft, nontender, nondistended, BS + x 4. MS: no deformity or atrophy. Skin: warm and dry, no rash. Neuro:  Strength and sensation are intact. Psych: Normal affect.  Accessory Clinical Findings    Recent Labs: No results found for requested labs within last 365 days.   Recent Lipid Panel    Component Value Date/Time   CHOL 153 03/13/2022 0824   TRIG 70 03/13/2022 0824   HDL 67 03/13/2022 0824   LDLCALC 72 03/13/2022 0824         ECG personally reviewed by me today- EKG Interpretation Date/Time:  Wednesday September 23 2023 09:47:27 EDT Ventricular Rate:  39 PR Interval:  164 QRS Duration:  86 QT Interval:  512 QTC Calculation: 412 R Axis:   6  Text Interpretation: Marked sinus bradycardia with marked sinus arrhythmia When  compared with ECG of 19-May-2023 09:22, No significant change was found Confirmed by Emelia Hazy [phone]) on 09/23/2023 10:04:12 AM    ETT 05/15/2023  ECG rhythm shows normal sinus rhythm.  Stress Findings A Bruce protocol stress test was performed. Patient exercised for 10 min and 3 sec. Maximum HR of 141 bpm. MPHR 94.0%. Peak METS 11.8.  The patient experienced no angina during the test. The test was stopped because the patient experienced fatigue. The patient reported no symptoms during the stress test.  Stress ECG No ST deviation was noted. Arrhythmias during recovery: rare PVCs.ECG was interpretable and conclusive. The ECG was negative for ischemia.    Coronary CTA 03/26/2022   FINDINGS: Image quality: Average.   Artifact: Moderate (mis-registration).   Total coronary calcium  score of 0.   Coronary arteries: Normal coronary origins.  Right dominance.   Left Main Coronary Artery: Normal caliber vessel, originates from the left coronary cusp, bifurcates into left anterior descending artery and a left circumflex artery. No evidence of plaque or stenosis.   Left Anterior Descending Coronary Artery: Normal caliber vessel, reaches the apex, gives off 3 patent diagonal branches.   LAD is patent without evidence of plaque or stenosis.   First Diagonal Branch: Normal caliber vessel, patent.   Second Diagonal Branch: Normal caliber vessel, patent.   Third Diagonal Branch: Normal caliber vessel, patent.   Left Circumflex Artery: Normal caliber vessel, non-dominant, travels within the atrioventricular groove, gives off 3 small caliber but patent obtuse marginal branches. LCX is patent with no evidence of plaque or stenosis.   Right Coronary Artery: Dominant vessel, originates from right coronary cusp, terminates as a PDA and right posterolateral branch. No evidence of plaque or stenosis.   Left Atrium: Grossly normal in size with no left atrial appendage filling defect. Small  patent foramen ovale, cannot be ruled out.   Left Ventricle: Grossly normal in size. There are no stigmata of prior infarction. There is no abnormal filling defect.   Pulmonary arteries: Normal in size without proximal  filling defect.   Pulmonary veins: Normal pulmonary venous drainage.   Aorta: Normal size, 36.5 mm at the mid ascending aorta (level of the PA bifurcation) measured double oblique. No calcifications. No dissection.   Pericardium: Normal thickness with no significant effusion or calcium  present.   Cardiac valves: The aortic valve is trileaflet without significant calcification. The mitral valve is normal structure without significant calcification.   Extra-cardiac findings: See attached radiology report for non-cardiac structures.   IMPRESSION: 1. Total coronary calcium  score of 0.   2. Normal coronary origin with right dominance.   3. Small patent foramen ovale, cannot be ruled out.   4. CAD-RADS = 0 No evidence of CAD.   Left Main: Patent.   LAD: Patent.   First Diagonal Branch: Patent.   Second Diagonal Branch: Patent.   Third Diagonal Branch: Patent.   LCx: Patent.   RCA: Patent.   RECOMMENDATIONS:   Consider non-atherosclerotic causes of chest pain.   Electronically Signed: By: Madonna Large D.O. On: 03/27/2022 23:16  Assessment & Plan   1.  Bradycardia-EKG today shows sinus bradycardia with sinus arrhythmia 39 bpm.  Previously noted to have episodes of heart rate in the 30s at night.  He denies lightheadedness, presyncope or syncope.  Blood pressure well-controlled.  He remains very physically active.  He has been doing regular physical activity for about 45 minutes 3 days/week. Does note recent increased fatigue.  Maintain p.o. hydration Patient reassured Order CBC, BMP, thyroid  panel Ordered Echo 7 day event monitor-applied today Refer to EP  Essential hypertension-BP today 132/64. Maintain blood pressure log Continue  valsartan  Heart healthy low-sodium diet Maintain physical activity  OSA-reports compliance with CPAP.  Reports maintaining device well.  Denies mask leaks and reports extended use during sleep cycles.  sleep hygiene instructions Continue CPAP use Maintain healthy weight  Disposition: Follow-up with Dr. Ladona or me in 9-12 months.   Josefa HERO. Ashima Shrake NP-C     09/23/2023, 10:25 AM [facility] [address] [place], [place] [address] Office [phone]      I spent  14 minutes examining this patient, reviewing medications, and using patient centered shared decision making involving their cardiac care.   I  spent  20 minutes reviewing past medical history,  medications, and prior cardiac tests.

## 2023-09-24 ENCOUNTER — Ambulatory Visit: Payer: Self-pay | Admitting: General Practice

## 2023-09-24 LAB — BASIC METABOLIC PANEL WITH GFR
BUN/Creatinine Ratio: 21 (ref 10–24)
BUN: 19 mg/dL (ref 8–27)
CO2: 21 mmol/L (ref 20–29)
Calcium: 9.8 mg/dL (ref 8.6–10.2)
Chloride: 104 mmol/L (ref 96–106)
Creatinine, Ser: 0.91 mg/dL (ref 0.76–1.27)
Glucose: 128 mg/dL — ABNORMAL HIGH (ref 70–99)
Potassium: 5 mmol/L (ref 3.5–5.2)
Sodium: 141 mmol/L (ref 134–144)
eGFR: 91 mL/min/1.73 (ref >59)

## 2023-09-24 LAB — THYROID PANEL WITH TSH
Free Thyroxine Index: 1.9 (ref 1.2–4.9)
T3 Uptake Ratio: 28 % (ref 24–39)
T4, Total: 6.8 ug/dL (ref 4.5–12.0)
TSH: 8.54 u[IU]/mL — ABNORMAL HIGH (ref 0.450–4.500)

## 2023-09-24 LAB — CBC
Hematocrit: 44.3 % (ref 37.5–51.0)
Hemoglobin: 14 g/dL (ref 13.0–17.7)
MCH: 28.9 pg (ref 26.6–33.0)
MCHC: 31.6 g/dL (ref 31.5–35.7)
MCV: 92 fL (ref 79–97)
Platelets: 285 x10E3/uL (ref 150–450)
RBC: 4.84 x10E6/uL (ref 4.14–5.80)
RDW: 13.6 % (ref 11.6–15.4)
WBC: 7.6 x10E3/uL (ref 3.4–10.8)

## 2023-09-25 ENCOUNTER — Ambulatory Visit: Admitting: Nurse Practitioner

## 2023-09-25 DIAGNOSIS — R001 Bradycardia, unspecified: Secondary | ICD-10-CM | POA: Diagnosis not present

## 2023-09-25 DIAGNOSIS — E611 Iron deficiency: Secondary | ICD-10-CM | POA: Diagnosis not present

## 2023-09-30 ENCOUNTER — Ambulatory Visit (HOSPITAL_COMMUNITY)
Admission: RE | Admit: 2023-09-30 | Discharge: 2023-09-30 | Disposition: A | Discharge status: Home / Self Care | Source: Ambulatory Visit | Attending: General Practice | Admitting: General Practice

## 2023-09-30 DIAGNOSIS — R5383 Other fatigue: Secondary | ICD-10-CM | POA: Diagnosis not present

## 2023-09-30 DIAGNOSIS — R001 Bradycardia, unspecified: Secondary | ICD-10-CM | POA: Insufficient documentation

## 2023-10-02 ENCOUNTER — Telehealth: Payer: Self-pay | Admitting: General Practice

## 2023-10-02 NOTE — Telephone Encounter (Signed)
 Testing has been completed but study not read currently.  Will forward to ordered provider for review.

## 2023-10-02 NOTE — Telephone Encounter (Signed)
  Patient is following up echo result

## 2023-10-04 LAB — ECHOCARDIOGRAM COMPLETE
AR max vel: 1.45 cm2
AV Area VTI: 1.46 cm2
AV Area mean vel: 1.44 cm2
AV Mean grad: 4 mmHg
AV Peak grad: 8.5 mmHg
Ao pk vel: 1.46 m/s
Area-P 1/2: 3.37 cm2
MV M vel: 1.4 m/s
MV Peak grad: 7.8 mmHg
S' Lateral: 2.86 cm

## 2023-10-07 ENCOUNTER — Other Ambulatory Visit (HOSPITAL_COMMUNITY)

## 2023-10-09 DIAGNOSIS — R001 Bradycardia, unspecified: Secondary | ICD-10-CM | POA: Diagnosis not present

## 2023-10-09 DIAGNOSIS — R5383 Other fatigue: Secondary | ICD-10-CM | POA: Diagnosis not present

## 2023-10-10 ENCOUNTER — Telehealth: Payer: Self-pay | Admitting: Physician Assistant

## 2023-10-10 NOTE — Telephone Encounter (Signed)
 I received a call from [facility] about a 7-second pause on this patient.  They state that it had happened on September 17.  The study highlights are in the computer and he has an appointment to see Dr. Waddell on 10/17.  No recent symptoms are reported.   Shona Shad, PA-C 10/10/2023 6:00 PM

## 2023-10-12 DIAGNOSIS — R5383 Other fatigue: Secondary | ICD-10-CM

## 2023-10-12 DIAGNOSIS — R001 Bradycardia, unspecified: Secondary | ICD-10-CM | POA: Diagnosis not present

## 2023-10-22 DIAGNOSIS — Z79899 Other long term (current) drug therapy: Secondary | ICD-10-CM | POA: Diagnosis not present

## 2023-10-22 DIAGNOSIS — E611 Iron deficiency: Secondary | ICD-10-CM | POA: Diagnosis not present

## 2023-10-23 ENCOUNTER — Encounter: Payer: Self-pay | Admitting: Internal Medicine

## 2023-10-23 ENCOUNTER — Ambulatory Visit: Attending: Internal Medicine | Admitting: Internal Medicine

## 2023-10-23 VITALS — BP 134/73 | HR 67 | Resp 16 | Ht 63.0 in | Wt 158.7 lb

## 2023-10-23 DIAGNOSIS — I495 Sick sinus syndrome: Secondary | ICD-10-CM

## 2023-10-23 NOTE — Progress Notes (Signed)
 HPI Thomas Banks is referred for evaluation of sinus node dysfunction. He is a pleasant 73 yo patient of Dr. Godfrey who has sinus node dysfunction. He is active and exercises without limitation. He wore a cardiac monitor which demonstrated pauses lasting almost 7 seconds. The patient denies any symptoms. He has no syncope or near syncope or dizziness.  Allergies  Allergen Reactions   Streptomycin      Current Outpatient Medications  Medication Sig Dispense Refill   Cholecalciferol (VITAMIN D3) 125 MCG (5000 UT) TABS Take 10,000 Units by mouth daily.     Cyanocobalamin  (VITAMIN B 12) 500 MCG TABS Take 1 tablet by mouth daily. (Patient taking differently: Take 5,000 mcg by mouth daily.) 90 tablet 3   levothyroxine  (SYNTHROID ) 88 MCG tablet Take 88 mcg by mouth daily before breakfast.     Multiple Vitamin (MULTIVITAMIN) capsule Take 1 capsule by mouth daily.     Omega 3-6-9 CAPS Take 1 capsule by mouth daily.     rosuvastatin  (CRESTOR ) 20 MG tablet Take 1 tablet (20 mg total) by mouth daily. 90 tablet 3   valsartan  (DIOVAN ) 80 MG tablet Take 1 tablet (80 mg total) by mouth daily. 90 tablet 3   No current facility-administered medications for this visit.     Past Medical History:  Diagnosis Date   Hyperlipidemia    Hypertension    Hypothyroidism    Sleep apnea    uses Cpap   Thyroid  disease     ROS:   All systems reviewed and negative except as noted in the HPI.   Past Surgical History:  Procedure Laterality Date   COLONOSCOPY     DRUG INDUCED ENDOSCOPY     NASAL SEPTOPLASTY W/ TURBINOPLASTY Bilateral 02/14/2022   Procedure: NASAL SEPTOPLASTY WITH TURBINATE REDUCTION;  Surgeon: Llewellyn Gerard LABOR, DO;  Location: [facility] OR;  Service: ENT;  Laterality: Bilateral;     Family History  Problem Relation Age of Onset   Tongue cancer Father 75       smoked     Social History   Socioeconomic History   Marital status: Single    Spouse name: Not on file   Number of  children: 3   Years of education: Not on file   Highest education level: Not on file  Occupational History   Not on file  Tobacco Use   Smoking status: Former    Current packs/day: 0.00    Average packs/day: 0.3 packs/day for 25.0 years (6.3 ttl pk-yrs)    Types: Cigarettes    Start date: 74    Quit date: 2000    Years since quitting: 25.8   Smokeless tobacco: Never  Vaping Use   Vaping status: Never Used  Substance and Sexual Activity   Alcohol use: Not Currently   Drug use: Never   Sexual activity: Not on file  Other Topics Concern   Not on file  Social History Narrative   Not on file   Social Drivers of Health   Financial Resource Strain: Not on file  Food Insecurity: No Food Insecurity (08/08/2021)   Hunger Vital Sign    Worried About Running Out of Food in the Last Year: Never true    Ran Out of Food in the Last Year: Never true  Transportation Needs: No Transportation Needs (08/08/2021)   PRAPARE - Administrator, Civil Service (Medical): No    Lack of Transportation (Non-Medical): No  Physical Activity: Not on file  Stress: Not on file  Social Connections: Not on file  Intimate Partner Violence: Not on file     BP 134/73 (BP Location: Left Arm, Patient Position: Sitting, Cuff Size: Normal)   Pulse 67   Resp 16   Ht 5' 3 (1.6 m)   Wt 158 lb 11.2 oz (72 kg)   SpO2 98%   BMI 28.11 kg/m   Physical Exam:  Well appearing NAD HEENT: Unremarkable Neck:  No JVD, no thyromegally Lymphatics:  No adenopathy Back:  No CVA tenderness Lungs:  Clear with no wheezes HEART:  Regular rate rhythm, no murmurs, no rubs, no clicks Abd:  soft, positive bowel sounds, no organomegally, no rebound, no guarding Ext:  2 plus pulses, no edema, no cyanosis, no clubbing Skin:  No rashes no nodules Neuro:  CN II through XII intact, motor grossly intact  Assess/Plan: Sinus node dysfunction - I have discussed PPM insertion with the patient. He thinks he was asleep  when he had the pauses despite it being 11 in the morning. He denies symptoms. I'll reach out to Dr. Ladona. HTN - his bp is controlled. No change in meds.  Danelle Riana Tessmer,MD

## 2023-10-23 NOTE — Patient Instructions (Signed)
 Medication Instructions:  Your physician recommends that you continue on your current medications as directed. Please refer to the Current Medication list given to you today.  *If you need a refill on your cardiac medications before your next appointment, please call your pharmacy*  Lab Work: None ordered.  You may go to any [facility] Location for your lab work:  [facility] - [address] ([facility]) - [address] [phone] [address]  [place] - [address]   [place]  - [address]   [place] - [address] - [address] [employer]  - [address] - [address] ([facility]   If you have labs (blood work) drawn today and your tests are completely normal, you will receive your results only by: [employer] (if you have MyChart)  If you have any lab test that is abnormal or we need to change your treatment, we will call you or send a MyChart message to review the results.  Testing/Procedures: None ordered.  Follow-Up: At [facility], you and your health needs are our priority.  As part of our continuing mission to provide you with exceptional heart care, we have created designated Provider Care Teams.  These Care Teams include your primary Cardiologist (physician) and Advanced Practice Providers (APPs -  Physician Assistants and Nurse Practitioners) who all work together to provide you with the care you need, when you need it.  We recommend signing up for the patient portal called MyChart.  Sign up information is provided on this After Visit Summary.  MyChart is used to connect with patients for Virtual Visits (Telemedicine).  Patients are able to view lab/test results, encounter notes, upcoming appointments, etc.  Non-urgent messages can be sent to your provider as well.   To learn more about what you can do with MyChart, go to  [email or web address].    Your next appointment:   To be determined

## 2023-10-28 DIAGNOSIS — K921 Melena: Secondary | ICD-10-CM | POA: Diagnosis not present

## 2023-11-02 DIAGNOSIS — I10 Essential (primary) hypertension: Secondary | ICD-10-CM | POA: Diagnosis not present

## 2023-11-02 DIAGNOSIS — E559 Vitamin D deficiency, unspecified: Secondary | ICD-10-CM | POA: Diagnosis not present

## 2023-11-02 DIAGNOSIS — G4733 Obstructive sleep apnea (adult) (pediatric): Secondary | ICD-10-CM | POA: Diagnosis not present

## 2023-11-02 DIAGNOSIS — E611 Iron deficiency: Secondary | ICD-10-CM | POA: Diagnosis not present

## 2023-11-02 DIAGNOSIS — R7303 Prediabetes: Secondary | ICD-10-CM | POA: Diagnosis not present

## 2023-11-09 ENCOUNTER — Encounter: Payer: Self-pay | Admitting: Radiology

## 2023-12-10 ENCOUNTER — Other Ambulatory Visit: Payer: Self-pay | Admitting: Family Medicine

## 2023-12-10 DIAGNOSIS — R200 Anesthesia of skin: Secondary | ICD-10-CM

## 2023-12-14 ENCOUNTER — Telehealth: Payer: Self-pay | Admitting: Internal Medicine

## 2023-12-14 ENCOUNTER — Inpatient Hospital Stay: Admission: RE | Admit: 2023-12-14 | Discharge: 2023-12-14 | Attending: Family Medicine | Admitting: Family Medicine

## 2023-12-14 DIAGNOSIS — R200 Anesthesia of skin: Secondary | ICD-10-CM

## 2023-12-14 NOTE — Telephone Encounter (Signed)
 Per Dr Adrian ov note  on 10/23/23: Sinus node dysfunction - I have discussed PPM insertion with the patient. He thinks he was asleep when he had the pauses despite it being 11 in the morning. He denies symptoms. I'll reach out to Dr. Ladona.  Pt is calling to see if Dr Waddell has talked to Dr Ladona yet. Please advise

## 2023-12-14 NOTE — Telephone Encounter (Signed)
 Spoke with the patient and advised message would be sent to Dr. Waddell and Dr. Ladona.

## 2023-12-15 NOTE — Telephone Encounter (Signed)
 Patient is calling back for update. Please advise

## 2023-12-17 NOTE — Telephone Encounter (Signed)
 Discussed with the patient, states that he was probably sleeping when the event occurred, he is not aware of any dizziness, fatigue or syncope or near syncope.  States that he has been exercising and running almost on a daily basis without symptoms.  I have discussed with him regarding indications for pacemaker, to call us  back if he repair to develop any of the above symptoms or if he were to develop any fatigue or exercise intolerance.  Patient prefers to continue observation for now.

## 2023-12-27 ENCOUNTER — Other Ambulatory Visit: Payer: Self-pay | Admitting: Cardiology

## 2023-12-27 DIAGNOSIS — I10 Essential (primary) hypertension: Secondary | ICD-10-CM

## 2024-01-21 ENCOUNTER — Encounter: Payer: Self-pay | Admitting: Cardiology

## 2024-01-21 ENCOUNTER — Ambulatory Visit: Attending: Cardiology | Admitting: Cardiology

## 2024-01-21 VITALS — BP 112/63 | HR 57 | Ht 63.0 in | Wt 159.0 lb

## 2024-01-21 DIAGNOSIS — I495 Sick sinus syndrome: Secondary | ICD-10-CM | POA: Diagnosis not present

## 2024-01-21 DIAGNOSIS — E7800 Pure hypercholesterolemia, unspecified: Secondary | ICD-10-CM | POA: Diagnosis not present

## 2024-01-21 DIAGNOSIS — I10 Essential (primary) hypertension: Secondary | ICD-10-CM

## 2024-01-21 DIAGNOSIS — G4733 Obstructive sleep apnea (adult) (pediatric): Secondary | ICD-10-CM

## 2024-01-21 NOTE — Patient Instructions (Addendum)
 Medication Instructions:  Your physician recommends that you continue on your current medications as directed. Please refer to the Current Medication list given to you today.  *If you need a refill on your cardiac medications before your next appointment, please call your pharmacy*   Follow-Up: At [facility], you and your health needs are our priority.  As part of our continuing mission to provide you with exceptional heart care, our providers are all part of one team.  This team includes your primary Cardiologist (physician) and Advanced Practice Providers or APPs (Physician Assistants and Nurse Practitioners) who all work together to provide you with the care you need, when you need it.  Your next appointment:   1 year(s)  Provider:   Gordy Bergamo, MD        We recommend signing up for the patient portal called MyChart.  Patients are able to view lab/test results, encounter notes, upcoming appointments, etc.  Non-urgent messages can be sent to your provider as well, go to [email or web address].

## 2024-01-21 NOTE — Progress Notes (Signed)
 " Cardiology Office Note:  .   Date:  01/21/2024  ID:  Thomas Banks, DOB [birth year], MRN [id number] PCP: Regino Slater, MD  [facility] Providers Cardiologist:  Gordy Bergamo, MD   History of Present Illness: .   Thomas Banks is a 74 y.o. hypertension, hyperglycemia, hyperlipidemia, and hypothyroidism, coronary CT angiogram on 03/27/2022 revealing normal coronary arteries with a coronary calcium  score of 0. He is being seen for bradycardia.  He underwent event monitor for 7 days on 09/23/2022 revealing episodes of 6 second pauses during daytime however patient was asymptomatic and probably sleeping at the time.  He is compliant with his CPAP but does not use CPAP when he dozes off during the daytime.  He is asymptomatic and continues to exercise on a daily basis with both running and brisk walking along with exercise.  He is concerned about muscle mass loss.  Otherwise no specific complaints, no dizziness, no palpitations, no fatigue.  He occasionally gets short of breath when he climbs 3 flights of stairs.    Discussed the use of AI scribe software for clinical note transcription with the patient, who gave verbal consent to proceed.  History of Present Illness Thomas Banks is a 74 year old male with sinus node dysfunction who presents for cardiovascular evaluation.  He has sinus node dysfunction identified on monitoring, which showed a 6.6-second pause, possibly while he was napping.  He has obstructive sleep apnea treated with CPAP, which he uses consistently with good control, though the mask seal is occasionally imperfect. He has shortness of breath with stair climbing or running, but a treadmill stress test 6 months ago showed excellent exercise capacity for his age, with peak heart rate at 94% of predicted.  He takes valsartan  80 mg for blood pressure and Crestor  20 mg for cholesterol. His coronary calcium  score is zero. He exercises daily for at least 30 minutes and follows a  vegetarian diet.  Cardiac Studies relevent.    Event monitor 7 days starting 09/23/2022 for bradycardia: Predominant rhythm was sinus rhythm.  Minimum heart rate was 33 bpm at 11:26 PM and maximum 122 bpm at 10:36 AM with average peak of 53 bpm. There were 44 pauses, longest 6.6 seconds that occurred at 11:42 AM on 09/23/2023. All of these episodes occurred on 09/23/2023 around 11:40 AM and no symptoms were reported. Take occasional PACs, atrial couplets and rare atrial triplets, isolated PVCs were present. No symptoms or patient activated events were noted throughout the wear time.          Treadmill Exercise Stress 05/28/2023: Patient exercised for 10 minutes on Bruce protocol and achieved 11.8 METS and 94% of MPHR.  No ST deviation was noted. Arrhythmias during recovery: rare PVCs. ECG was interpretable and conclusive. The ECG was negative for ischemia. Negative adequate stress test without evidence of ischemia at given workload.   Excellent exercise capacity, heart rate went up as appropriate.  Your heart rate response is very appropriate and your heart has capacity to increase your heart rate as needed when the exercise.  Hence resting low heart rate is not to be worried about.  ECHOCARDIOGRAM COMPLETE 09/30/2023  1. Left ventricular ejection fraction, by estimation, is 65 to 70%. The left ventricle has normal function. The left ventricle has no regional wall motion abnormalities. Left ventricular diastolic parameters were normal. 2. Right ventricular systolic function is normal. The right ventricular size is normal. There is normal pulmonary artery systolic pressure. The estimated right ventricular systolic pressure  is 24.7 mmHg. 3. The mitral valve is normal in structure. Trivial mitral valve regurgitation. No evidence of mitral stenosis. 4. The aortic valve is tricuspid. There is mild calcification of the aortic valve. Aortic valve regurgitation is not visualized. Aortic valve  sclerosis/calcification is present, without any evidence of aortic stenosis.  Coronary CTA 03/27/2022:  Coronary calcium  score of 0.  Right dominant circulation, normal coronary arteries.  Ascending and descending thoracic aortic measurements are normal.   EKG:      Labs   Recent Labs    09/23/23 1154  NA 141  K 5.0  CL 104  CO2 21  GLUCOSE 128*  BUN 19  CREATININE 0.91  CALCIUM  9.8    No results found for: ALT, AST, GGT, ALKPHOS, BILITOT    Latest Ref Rng & Units 09/23/2023   11:54 AM 02/14/2022    9:02 AM  CBC  WBC 3.4 - 10.8 x10E3/uL 7.6  6.0   Hemoglobin 13.0 - 17.7 g/dL 85.9  86.3   Hematocrit 37.5 - 51.0 % 44.3  41.8   Platelets 150 - 450 x10E3/uL 285  280     Lab Results  Component Value Date   TSH 8.540 (H) 09/23/2023    Care everywhere/Faxed External Labs: Labs 09/17/2023:  TSH  2.51, normal.  A1c 6.4%.  Total cholesterol 143, triglycerides 86, HDL 67, LDL 60.  ROS  Review of Systems  Cardiovascular:  Positive for dyspnea on exertion (occasional). Negative for chest pain, leg swelling, near-syncope, palpitations and paroxysmal nocturnal dyspnea.   Physical Exam:   VS:  BP 112/63 (BP Location: Left Arm, Patient Position: Sitting, Cuff Size: Normal)   Pulse (!) 57   Ht 5' 3 (1.6 m)   Wt 159 lb (72.1 kg)   SpO2 97%   BMI 28.17 kg/m    Wt Readings from Last 3 Encounters:  01/21/24 159 lb (72.1 kg)  10/23/23 158 lb 11.2 oz (72 kg)  09/23/23 159 lb (72.1 kg)    BP Readings from Last 3 Encounters:  01/21/24 112/63  10/23/23 134/73  09/23/23 132/64   Physical Exam Neck:     Vascular: No carotid bruit or JVD.  Cardiovascular:     Rate and Rhythm: Normal rate and regular rhythm.     Pulses: Intact distal pulses.     Heart sounds: Normal heart sounds. No murmur heard.    No gallop.  Pulmonary:     Effort: Pulmonary effort is normal.     Breath sounds: Normal breath sounds.  Abdominal:     General: Bowel sounds are normal.      Palpations: Abdomen is soft.  Musculoskeletal:     Right lower leg: No edema.     Left lower leg: No edema.     ASSESSMENT AND PLAN: .      ICD-10-CM   1. Sinus node dysfunction (HCC)  I49.5     2. Primary hypertension  I10     3. OSA on CPAP  G47.33     4. Pure hypercholesterolemia  E78.00      Assessment & Plan Sinus node dysfunction secondary to obstructive sleep apnea Sinus node dysfunction likely secondary to obstructive sleep apnea. Previous monitoring showed a 6.6-second pause in heart rhythm, but he remains asymptomatic. No pacemaker required due to lack of symptoms and effective CPAP use. - Continue CPAP therapy, including during daytime naps if feeling tired.  Obstructive sleep apnea on CPAP Obstructive sleep apnea well-managed with CPAP therapy. He reports consistent use  of the CPAP machine with good seal and no significant issues. CPAP use is crucial to prevent sinus node dysfunction and other complications. - Continue CPAP therapy.  Primary hypertension Well-controlled on valsartan  80 mg daily. Blood pressure is stable and within target range. - Continue valsartan  80 mg daily.  Pure hypercholesterolemia Managed with Crestor  20 mg daily. Recent lipid panel shows total cholesterol 143 mg/dL, triglycerides 86 mg/dL, HDL 67 mg/dL, and LDL 60 mg/dL, indicating excellent control. - Continue Crestor  20 mg daily.  Follow up: 1 Year for sinus node dysfunction, hypertension and hyperlipidemia  Signed,  Gordy Bergamo, MD, [facility] 01/21/2024, 10:14 PM [facility] [address] [place], [place] [address] Phone: [phone]. Fax:  [phone]  "

## 2024-02-19 ENCOUNTER — Ambulatory Visit: Admitting: Cardiology
